# Patient Record
Sex: Female | Born: 1940
Health system: Southern US, Community
[De-identification: ages and names within clinical notes are randomized; demographics above are authoritative.]

## PROBLEM LIST (undated history)

## (undated) DIAGNOSIS — K219 Gastro-esophageal reflux disease without esophagitis: Secondary | ICD-10-CM

## (undated) DIAGNOSIS — R7989 Other specified abnormal findings of blood chemistry: Secondary | ICD-10-CM

## (undated) DIAGNOSIS — I10 Essential (primary) hypertension: Secondary | ICD-10-CM

## (undated) DIAGNOSIS — M810 Age-related osteoporosis without current pathological fracture: Secondary | ICD-10-CM

## (undated) DIAGNOSIS — M1990 Unspecified osteoarthritis, unspecified site: Secondary | ICD-10-CM

## (undated) DIAGNOSIS — R945 Abnormal results of liver function studies: Secondary | ICD-10-CM

## (undated) HISTORY — PX: CARPAL TUNNEL RELEASE: SHX101

## (undated) HISTORY — DX: Age-related osteoporosis without current pathological fracture: M81.0

## (undated) HISTORY — PX: ROTATOR CUFF REPAIR: SHX139

## (undated) SURGERY — CARPAL TUNNEL RELEASE
Anesthesia: General | Laterality: Right

---

## 1998-03-08 ENCOUNTER — Encounter: Admission: RE | Admit: 1998-03-08 | Discharge: 1998-06-06 | Payer: Self-pay | Admitting: Orthopedic Surgery

## 1998-10-27 ENCOUNTER — Other Ambulatory Visit: Admission: RE | Admit: 1998-10-27 | Discharge: 1998-10-27 | Payer: Self-pay | Admitting: Urology

## 1999-04-28 ENCOUNTER — Other Ambulatory Visit: Admission: RE | Admit: 1999-04-28 | Discharge: 1999-04-28 | Payer: Self-pay | Admitting: Urology

## 1999-12-29 ENCOUNTER — Encounter: Admission: RE | Admit: 1999-12-29 | Discharge: 1999-12-29 | Payer: Self-pay | Admitting: Family Medicine

## 1999-12-29 ENCOUNTER — Encounter: Payer: Self-pay | Admitting: Family Medicine

## 2000-05-30 ENCOUNTER — Other Ambulatory Visit: Admission: RE | Admit: 2000-05-30 | Discharge: 2000-05-30 | Payer: Self-pay | Admitting: Family Medicine

## 2001-07-23 ENCOUNTER — Encounter: Payer: Self-pay | Admitting: Family Medicine

## 2001-07-23 ENCOUNTER — Ambulatory Visit (HOSPITAL_COMMUNITY): Admission: RE | Admit: 2001-07-23 | Discharge: 2001-07-23 | Payer: Self-pay | Admitting: Family Medicine

## 2002-07-16 ENCOUNTER — Ambulatory Visit (HOSPITAL_COMMUNITY): Admission: RE | Admit: 2002-07-16 | Discharge: 2002-07-16 | Payer: Self-pay | Admitting: Family Medicine

## 2002-07-16 ENCOUNTER — Encounter: Payer: Self-pay | Admitting: Family Medicine

## 2002-08-06 ENCOUNTER — Ambulatory Visit (HOSPITAL_COMMUNITY): Admission: RE | Admit: 2002-08-06 | Discharge: 2002-08-06 | Payer: Self-pay | Admitting: Family Medicine

## 2002-08-06 ENCOUNTER — Encounter: Payer: Self-pay | Admitting: Family Medicine

## 2003-08-10 ENCOUNTER — Ambulatory Visit (HOSPITAL_COMMUNITY): Admission: RE | Admit: 2003-08-10 | Discharge: 2003-08-10 | Payer: Self-pay | Admitting: Family Medicine

## 2003-09-02 ENCOUNTER — Ambulatory Visit (HOSPITAL_COMMUNITY): Admission: RE | Admit: 2003-09-02 | Discharge: 2003-09-02 | Payer: Self-pay | Admitting: Family Medicine

## 2004-02-11 ENCOUNTER — Other Ambulatory Visit: Admission: RE | Admit: 2004-02-11 | Discharge: 2004-02-11 | Payer: Self-pay | Admitting: Family Medicine

## 2004-05-04 ENCOUNTER — Ambulatory Visit (HOSPITAL_COMMUNITY): Admission: RE | Admit: 2004-05-04 | Discharge: 2004-05-04 | Payer: Self-pay | Admitting: Gastroenterology

## 2004-08-11 ENCOUNTER — Ambulatory Visit (HOSPITAL_COMMUNITY): Admission: RE | Admit: 2004-08-11 | Discharge: 2004-08-11 | Payer: Self-pay | Admitting: Family Medicine

## 2005-08-14 ENCOUNTER — Ambulatory Visit (HOSPITAL_COMMUNITY): Admission: RE | Admit: 2005-08-14 | Discharge: 2005-08-14 | Payer: Self-pay | Admitting: Family Medicine

## 2006-06-02 ENCOUNTER — Ambulatory Visit (HOSPITAL_COMMUNITY): Admission: RE | Admit: 2006-06-02 | Discharge: 2006-06-02 | Payer: Self-pay | Admitting: Orthopedic Surgery

## 2006-08-20 ENCOUNTER — Ambulatory Visit (HOSPITAL_COMMUNITY): Admission: RE | Admit: 2006-08-20 | Discharge: 2006-08-20 | Payer: Self-pay | Admitting: Family Medicine

## 2006-11-01 ENCOUNTER — Ambulatory Visit (HOSPITAL_COMMUNITY): Admission: RE | Admit: 2006-11-01 | Discharge: 2006-11-01 | Payer: Self-pay | Admitting: Family Medicine

## 2007-08-23 ENCOUNTER — Ambulatory Visit (HOSPITAL_COMMUNITY): Admission: RE | Admit: 2007-08-23 | Discharge: 2007-08-23 | Payer: Self-pay | Admitting: Family Medicine

## 2007-09-02 ENCOUNTER — Ambulatory Visit (HOSPITAL_COMMUNITY): Admission: RE | Admit: 2007-09-02 | Discharge: 2007-09-02 | Payer: Self-pay | Admitting: Gastroenterology

## 2007-09-02 ENCOUNTER — Encounter (INDEPENDENT_AMBULATORY_CARE_PROVIDER_SITE_OTHER): Payer: Self-pay | Admitting: Interventional Radiology

## 2007-10-05 ENCOUNTER — Emergency Department (HOSPITAL_COMMUNITY): Admission: EM | Admit: 2007-10-05 | Discharge: 2007-10-05 | Payer: Self-pay | Admitting: Emergency Medicine

## 2007-11-06 ENCOUNTER — Ambulatory Visit (HOSPITAL_COMMUNITY): Admission: RE | Admit: 2007-11-06 | Discharge: 2007-11-06 | Payer: Self-pay | Admitting: Orthopedic Surgery

## 2008-02-03 ENCOUNTER — Encounter (HOSPITAL_COMMUNITY): Admission: RE | Admit: 2008-02-03 | Discharge: 2008-03-04 | Payer: Self-pay | Admitting: Orthopedic Surgery

## 2008-03-06 ENCOUNTER — Encounter (HOSPITAL_COMMUNITY): Admission: RE | Admit: 2008-03-06 | Discharge: 2008-04-05 | Payer: Self-pay | Admitting: Orthopedic Surgery

## 2008-06-24 ENCOUNTER — Ambulatory Visit (HOSPITAL_COMMUNITY): Admission: RE | Admit: 2008-06-24 | Discharge: 2008-06-24 | Payer: Self-pay | Admitting: Neurosurgery

## 2008-08-26 ENCOUNTER — Ambulatory Visit (HOSPITAL_COMMUNITY): Admission: RE | Admit: 2008-08-26 | Discharge: 2008-08-26 | Payer: Self-pay | Admitting: Family Medicine

## 2008-12-11 ENCOUNTER — Ambulatory Visit (HOSPITAL_COMMUNITY): Admission: RE | Admit: 2008-12-11 | Discharge: 2008-12-11 | Payer: Self-pay | Admitting: Orthopedic Surgery

## 2009-02-24 ENCOUNTER — Ambulatory Visit (HOSPITAL_COMMUNITY): Admission: RE | Admit: 2009-02-24 | Discharge: 2009-02-24 | Payer: Self-pay | Admitting: Orthopedic Surgery

## 2009-03-30 ENCOUNTER — Ambulatory Visit (HOSPITAL_COMMUNITY): Admission: RE | Admit: 2009-03-30 | Discharge: 2009-03-31 | Payer: Self-pay | Admitting: Orthopedic Surgery

## 2009-04-28 ENCOUNTER — Encounter (HOSPITAL_COMMUNITY): Admission: RE | Admit: 2009-04-28 | Discharge: 2009-05-28 | Payer: Self-pay | Admitting: Orthopedic Surgery

## 2009-05-29 ENCOUNTER — Encounter (HOSPITAL_COMMUNITY): Admission: RE | Admit: 2009-05-29 | Discharge: 2009-06-28 | Payer: Self-pay | Admitting: Orthopedic Surgery

## 2009-06-29 ENCOUNTER — Encounter (HOSPITAL_COMMUNITY): Admission: RE | Admit: 2009-06-29 | Discharge: 2009-07-23 | Payer: Self-pay | Admitting: Orthopedic Surgery

## 2009-07-27 ENCOUNTER — Encounter (HOSPITAL_COMMUNITY): Admission: RE | Admit: 2009-07-27 | Discharge: 2009-08-26 | Payer: Self-pay | Admitting: Orthopedic Surgery

## 2009-09-02 ENCOUNTER — Ambulatory Visit (HOSPITAL_COMMUNITY): Admission: RE | Admit: 2009-09-02 | Discharge: 2009-09-02 | Payer: Self-pay | Admitting: Family Medicine

## 2010-06-03 ENCOUNTER — Encounter: Admission: RE | Admit: 2010-06-03 | Discharge: 2010-06-03 | Payer: Self-pay | Admitting: Gastroenterology

## 2010-08-14 ENCOUNTER — Encounter: Payer: Self-pay | Admitting: Orthopedic Surgery

## 2010-08-29 ENCOUNTER — Other Ambulatory Visit (HOSPITAL_COMMUNITY): Payer: Self-pay | Admitting: Family Medicine

## 2010-08-29 DIAGNOSIS — Z139 Encounter for screening, unspecified: Secondary | ICD-10-CM

## 2010-09-05 ENCOUNTER — Ambulatory Visit (HOSPITAL_COMMUNITY)
Admission: RE | Admit: 2010-09-05 | Discharge: 2010-09-05 | Disposition: A | Discharge status: Home / Self Care | Payer: MEDICARE | Source: Ambulatory Visit | Attending: Family Medicine | Admitting: Family Medicine

## 2010-09-05 DIAGNOSIS — Z1231 Encounter for screening mammogram for malignant neoplasm of breast: Secondary | ICD-10-CM | POA: Insufficient documentation

## 2010-09-05 DIAGNOSIS — Z139 Encounter for screening, unspecified: Secondary | ICD-10-CM

## 2010-09-16 ENCOUNTER — Ambulatory Visit (HOSPITAL_COMMUNITY)
Admission: RE | Admit: 2010-09-16 | Discharge: 2010-09-16 | Disposition: A | Discharge status: Home / Self Care | Payer: MEDICARE | Source: Ambulatory Visit | Attending: Orthopedic Surgery | Admitting: Orthopedic Surgery

## 2010-09-16 ENCOUNTER — Other Ambulatory Visit (HOSPITAL_COMMUNITY): Payer: Self-pay | Admitting: Orthopedic Surgery

## 2010-09-16 DIAGNOSIS — L539 Erythematous condition, unspecified: Secondary | ICD-10-CM

## 2010-09-16 DIAGNOSIS — M25539 Pain in unspecified wrist: Secondary | ICD-10-CM | POA: Insufficient documentation

## 2010-09-16 DIAGNOSIS — R52 Pain, unspecified: Secondary | ICD-10-CM

## 2010-09-16 DIAGNOSIS — M19039 Primary osteoarthritis, unspecified wrist: Secondary | ICD-10-CM | POA: Insufficient documentation

## 2010-09-16 DIAGNOSIS — R609 Edema, unspecified: Secondary | ICD-10-CM | POA: Insufficient documentation

## 2010-09-16 DIAGNOSIS — M11239 Other chondrocalcinosis, unspecified wrist: Secondary | ICD-10-CM | POA: Insufficient documentation

## 2010-09-16 DIAGNOSIS — M249 Joint derangement, unspecified: Secondary | ICD-10-CM | POA: Insufficient documentation

## 2010-10-28 LAB — COMPREHENSIVE METABOLIC PANEL
ALT: 62 U/L — ABNORMAL HIGH (ref 0–35)
Alkaline Phosphatase: 174 U/L — ABNORMAL HIGH (ref 39–117)
CO2: 28 mEq/L (ref 19–32)
Chloride: 99 mEq/L (ref 96–112)
Glucose, Bld: 112 mg/dL — ABNORMAL HIGH (ref 70–99)
Potassium: 3.4 mEq/L — ABNORMAL LOW (ref 3.5–5.1)
Sodium: 134 mEq/L — ABNORMAL LOW (ref 135–145)
Total Bilirubin: 0.6 mg/dL (ref 0.3–1.2)
Total Protein: 7.9 g/dL (ref 6.0–8.3)

## 2010-10-28 LAB — URINALYSIS, ROUTINE W REFLEX MICROSCOPIC
Glucose, UA: NEGATIVE mg/dL
Ketones, ur: NEGATIVE mg/dL
Leukocytes, UA: NEGATIVE
Protein, ur: NEGATIVE mg/dL
Urobilinogen, UA: 0.2 mg/dL (ref 0.0–1.0)

## 2010-10-28 LAB — CBC
Hemoglobin: 14.1 g/dL (ref 12.0–15.0)
RBC: 4.3 MIL/uL (ref 3.87–5.11)
RDW: 12.8 % (ref 11.5–15.5)
WBC: 8.8 10*3/uL (ref 4.0–10.5)

## 2010-10-28 LAB — GLUCOSE, CAPILLARY
Glucose-Capillary: 106 mg/dL — ABNORMAL HIGH (ref 70–99)
Glucose-Capillary: 126 mg/dL — ABNORMAL HIGH (ref 70–99)
Glucose-Capillary: 140 mg/dL — ABNORMAL HIGH (ref 70–99)
Glucose-Capillary: 95 mg/dL (ref 70–99)

## 2010-12-09 NOTE — Op Note (Signed)
NAMEJAMIN, Leslie Berry                ACCOUNT NO.:  [id number]   MEDICAL RECORD NO.:  [id number]          PATIENT TYPE:  AMB   LOCATION:  ENDO                         FACILITY:  [facility]   PHYSICIAN:  Anselmo Rod, M.D.  DATE OF BIRTH:  1941-05-14   DATE OF PROCEDURE:  05/04/2004  DATE OF DISCHARGE:                                 OPERATIVE REPORT   PROCEDURE PERFORMED:  Esophagogastroduodenoscopy with antral biopsy for  CLOtest.   ENDOSCOPIST:  Anselmo Rod, M.D.   INSTRUMENT USED:  Olympus video panendoscope.   INDICATION FOR PROCEDURE:  A 70 year old African-American female undergoing  an EGD for guaiac-positive stools.  Rule out peptic ulcer disease,  esophagitis, gastritis, etc.   PREPROCEDURE PREPARATION:  The patient was fasted for eight hours prior to  the procedure.   PREPROCEDURE PHYSICAL:  VITAL SIGNS:  The patient had stable vital signs.  NECK:  Supple.  CHEST:  Clear to auscultation.  S1, S2 regular.  ABDOMEN:  Soft with normal bowel sounds.   DESCRIPTION OF PROCEDURE:  The patient was placed in the left lateral  decubitus position and sedated with Demerol and Versed for the EGD.  No  additional sedation was used for the colonoscopy.  Once the patient was  adequately sedate and maintained on low-flow oxygen and continuous cardiac  monitoring, the Olympus video panendoscope was advanced through the  mouthpiece, over the tongue, and into the esophagus under direct vision.  The entire esophagus appeared normal proximally.  Mild distal esophagitis  noted at the GE junction.  A small hiatal hernia was seen on high  retroflexion.  Diffuse gastritis was noted throughout the gastric mucosa.  Antral biopsies were done x1 for CLOtest.  The proximal small bowel appeared  normal.  There was no outlet obstruction.  The patient had a prominent  ampulla.  The patient tolerated the procedure well without immediate  complications.   IMPRESSION:  1.  Distal esophagitis at  gastroesophageal junction.  2.  Small hiatal hernia.  3.  Diffuse gastritis, CLOtest done.  4.  Normal proximal small bowel, prominent ampulla.   RECOMMENDATIONS:  1.  Await CLOtest results, treat with antibiotics if CLOtest positive.  2.  Avoid nonsteroidals.  3.  Outpatient follow-up for repeat guaiac testing.  Further recommendations      will be made thereafter.       JNM/MEDQ  D:  05/04/2004  T:  05/04/2004  Job:  [id number]   cc:   Renaye Rakers, M.D.  [phone] [address]  [place]  [place] [address]  Fax: [phone]

## 2010-12-09 NOTE — Op Note (Signed)
Leslie Berry, Leslie Berry                ACCOUNT NO.:  [id number]   MEDICAL RECORD NO.:  [id number]          PATIENT TYPE:  AMB   LOCATION:  ENDO                         FACILITY:  [facility]   PHYSICIAN:  Anselmo Rod, M.D.  DATE OF BIRTH:  1940/12/19   DATE OF PROCEDURE:  05/04/2004  DATE OF DISCHARGE:                                 OPERATIVE REPORT   PROCEDURE PERFORMED:  Screening colonoscopy.   ENDOSCOPIST:  Anselmo Rod, M.D.   INSTRUMENT USED:  Olympus video colonoscope.   INDICATION FOR PROCEDURE:  A 70 year old African-American female with a  history of guaiac-positive stool and a family history of colon cancer,  undergoing a screening colonoscopy.  Rule out colonic polyps, masses, etc.   PREPROCEDURE PREPARATION:  Informed consent was procured from the patient.  The patient was fasted for eight hours prior to the procedure and prepped  with a bottle of magnesium citrate and a gallon of GoLYTELY the night prior  to the procedure.   PREPROCEDURE PHYSICAL:  VITAL SIGNS:  The patient had stable vital signs.  NECK:  Supple.  CHEST:  Clear to auscultation.  S1, S2 regular.  ABDOMEN:  Soft with normal bowel sounds.   DESCRIPTION OF PROCEDURE:  The patient was placed in the left lateral  decubitus position and sedated with 75 mg of Demerol and 5 mg of Versed in  slow incremental doses.  Once the patient was adequately sedate and  maintained on low-flow oxygen and continuous cardiac monitoring, the Olympus  video colonoscope was advanced from the rectum to the cecum.  The  appendiceal orifice and the ileocecal valve were clearly visualized and  photographed.  The patient's position was changed from the left lateral to  the supine position with gentle application of abdominal pressure to reach  the cecum.  There was evidence of sigmoid diverticulosis.  No masses,  polyps, erosions, or ulcerations were seen.  Retroflexion in the rectum  revealed small internal hemorrhoids.  The  patient tolerated the procedure  well without immediate complications.  The terminal ileum appeared normal.   IMPRESSION:  1.  Small internal hemorrhoids.  2.  Sigmoid diverticulosis.  3.  No masses or polyps seen.  4.  Normal terminal ileum.  5.  Some residual stool in the colon, multiple washes done.   RECOMMENDATIONS:  1.  Brochures on diverticulosis along with a high-fiber diet have been given      to the patient for her education.  2.  Proceed with an EGD at this time.  Further recommendations made      thereafter.  3.  Repeat colonoscopy in the next five years unless the patient develops      any abdominal symptoms in the interim.       JNM/MEDQ  D:  05/04/2004  T:  05/04/2004  Job:  [id number]   cc:   Renaye Rakers, M.D.  [phone] [address]  [place]  [place] [address]  Fax: [phone]

## 2011-02-20 ENCOUNTER — Other Ambulatory Visit (HOSPITAL_COMMUNITY): Payer: Self-pay | Admitting: Orthopedic Surgery

## 2011-02-20 ENCOUNTER — Ambulatory Visit (HOSPITAL_COMMUNITY)
Admission: RE | Admit: 2011-02-20 | Discharge: 2011-02-20 | Disposition: A | Discharge status: Home / Self Care | Payer: PRIVATE HEALTH INSURANCE | Source: Ambulatory Visit | Attending: Orthopedic Surgery | Admitting: Orthopedic Surgery

## 2011-02-20 DIAGNOSIS — M11239 Other chondrocalcinosis, unspecified wrist: Secondary | ICD-10-CM | POA: Insufficient documentation

## 2011-02-20 DIAGNOSIS — M25539 Pain in unspecified wrist: Secondary | ICD-10-CM | POA: Insufficient documentation

## 2011-02-20 DIAGNOSIS — M899 Disorder of bone, unspecified: Secondary | ICD-10-CM | POA: Insufficient documentation

## 2011-02-20 DIAGNOSIS — R937 Abnormal findings on diagnostic imaging of other parts of musculoskeletal system: Secondary | ICD-10-CM | POA: Insufficient documentation

## 2011-04-14 LAB — CBC
Hemoglobin: 14.7
MCHC: 34.4
Platelets: 268
RDW: 13.2

## 2011-04-14 LAB — PROTIME-INR: INR: 1

## 2011-04-17 LAB — BASIC METABOLIC PANEL
BUN: 15
Calcium: 9.5
Chloride: 101
Creatinine, Ser: 0.7
GFR calc Af Amer: >60

## 2011-04-17 LAB — CBC
MCHC: 34.4
MCV: 94.4
Platelets: 246
WBC: 8.4

## 2011-04-17 LAB — URIC ACID: Uric Acid, Serum: 8 — ABNORMAL HIGH

## 2011-04-17 LAB — DIFFERENTIAL
Basophils Relative: 1
Eosinophils Absolute: 0.1
Lymphs Abs: 2.4
Neutro Abs: 5.2
Neutrophils Relative %: 61

## 2011-05-17 ENCOUNTER — Ambulatory Visit (HOSPITAL_COMMUNITY)
Admission: RE | Admit: 2011-05-17 | Discharge: 2011-05-17 | Disposition: A | Discharge status: Home / Self Care | Payer: 59 | Source: Ambulatory Visit | Attending: Orthopedic Surgery | Admitting: Orthopedic Surgery

## 2011-05-17 ENCOUNTER — Other Ambulatory Visit (HOSPITAL_COMMUNITY): Payer: Self-pay | Admitting: Orthopedic Surgery

## 2011-05-17 DIAGNOSIS — M25559 Pain in unspecified hip: Secondary | ICD-10-CM | POA: Insufficient documentation

## 2011-05-17 DIAGNOSIS — M659 Synovitis and tenosynovitis, unspecified: Secondary | ICD-10-CM

## 2011-05-30 ENCOUNTER — Ambulatory Visit (HOSPITAL_COMMUNITY)
Admission: RE | Admit: 2011-05-30 | Discharge: 2011-05-30 | Disposition: A | Discharge status: Home / Self Care | Payer: Medicare Other | Source: Ambulatory Visit | Attending: Orthopedic Surgery | Admitting: Orthopedic Surgery

## 2011-05-30 DIAGNOSIS — M6281 Muscle weakness (generalized): Secondary | ICD-10-CM | POA: Insufficient documentation

## 2011-05-30 DIAGNOSIS — R262 Difficulty in walking, not elsewhere classified: Secondary | ICD-10-CM | POA: Insufficient documentation

## 2011-05-30 DIAGNOSIS — M25559 Pain in unspecified hip: Secondary | ICD-10-CM | POA: Insufficient documentation

## 2011-05-30 DIAGNOSIS — R29898 Other symptoms and signs involving the musculoskeletal system: Secondary | ICD-10-CM | POA: Insufficient documentation

## 2011-05-30 DIAGNOSIS — IMO0001 Reserved for inherently not codable concepts without codable children: Secondary | ICD-10-CM | POA: Insufficient documentation

## 2011-05-30 NOTE — Patient Instructions (Signed)
HEP

## 2011-05-30 NOTE — Progress Notes (Signed)
Physical Therapy Evaluation  Patient Details  Name: Leslie Berry MRN: [id number] Date of Birth: Dec 17, 1940  Today's Date: 05/30/2011 Time: 4098-1191 Time Calculation (min): 44 min Visit#: 1  of 6   Re-eval: 06/13/11 Assessment Diagnosis: L hip pain. Next MD Visit: 06/07/2011 Charge:  Evaluation, ultrasound Past Medical History: No past medical history on file. Past Surgical History: No past surgical history on file.  Subjective Symptoms/Limitations Symptoms: Leslie Berry states that she had progressive L hip pain for the past three weeks.  She states that she has had back pain for two to three years that will occasionally go into her left leg.  Recently, however, it feels as if her L hip wants to lock up on her.  She states that her hip does not bother her when she is sitting but it almost immediately bothers her when she starts walking.  The patient states that she feels as if she is going to fall at times. Limitations: Standing;Walking How long can you sit comfortably?: Sitting is no problem. How long can you stand comfortably?: The patient states that she has some problems standing stating that she needs to take a break after 10-15 minutes. How long can you walk comfortably?: The patient states that she can walk less than 5 minutes due to pain. Pain Assessment Currently in Pain?:  (Pt is sitting; states with walking pain increases to an 8.) Pain Score:   8 Pain Location: Hip Pain Orientation: Left;Lateral Pain Type: Acute pain Pain Frequency: Intermittent Pain Relieving Factors: sitting down gives almost immediate relief. Multiple Pain Sites: Yes  Prior Functioning  Home Living Home Layout: One level Home Access: Stairs to enter Entrance Stairs-Rails: Left Entrance Stairs-Number of Steps: 2 (unable to take steps reciprocally.)  Assessment RLE Strength Right Hip Flexion: 5/5 Right Hip Extension: 5/5 Right Hip ABduction: 5/5 Right Hip ADduction: 5/5 Right Knee Flexion:  5/5 Right Knee Extension: 5/5 Right Ankle Dorsiflexion: 5/5 LLE Strength Left Hip Flexion: 3/5 Left Hip Extension: 3/5 Left Hip ABduction: 2/5 Left Hip ADduction: 5/5 Left Knee Flexion: 5/5 Left Knee Extension: 5/5 Left Ankle Dorsiflexion: 4/5 Lumbar AROM Lumbar Flexion: wnl  Lumbar Extension: wnl with repetition no change Lumbar - Right Side Bend: wnl  Lumbar - Left Side Bend: wnl Lumbar - Right Rotation: wnl Lumbar - Left Rotation: wnl  Exercise/Treatments Stretches Quad Stretch: Limitations;5 reps (knee to chest ) Hip Exercises Straight Leg Raises: Left;5 reps;Limitations (knee bent to 40 degrees.) Hip ABduction/ADduction: 10 reps;Limitations (done supine as clam) Bridges: 10 reps   Modalities Modalities: Ultrasound Ultrasound Ultrasound Location: L greater trochanter Ultrasound Parameters: 2 mghz at 1.2 w/cm 2 x 8 min. Ultrasound Goals: Pain  Physical Therapy Assessment and Plan PT Assessment and Plan Clinical Impression Statement: Pt with decreased strength of L hip as well as pain causing antalgic gait and decreased functional ability.  Pt will benefit from skilled PT to address the above issues. Rehab Potential: Good Clinical Impairments Affecting Rehab Potential: pain, mm weakness PT Frequency: Min 3X/week PT Duration:  (2 weeks) PT Treatment/Interventions: Gait training;Therapeutic exercise;Other (comment) (modalities for pain) PT Plan: See 3x wk for 2 weeks.  Next treatment add Bike, ITBand stretch, supine ab/adduction, isometric adduction/abduction and gait training.    Goals Home Exercise Program Pt will Perform Home Exercise Program: Independently PT Short Term Goals Time to Complete Short Term Goals: 2 weeks PT Short Term Goal 1: Pain decreased to no greater than a 2 PT Short Term Goal 2: strength improvec 1/2 grade. PT  Short Term Goal 3: normalized heel toe gt with = stance time.  Problem List Patient Active Problem List  Diagnoses  . Left leg  weakness    PT - End of Session Activity Tolerance: Patient tolerated treatment well General Behavior During Session: [facility] for tasks performed Cognition: [facility] for tasks performed   Leslie Berry,Leslie Berry 05/30/2011, 10:56 AM  Physician Documentation Your signature is required to indicate approval of the treatment plan as stated above.  Please sign and either send electronically or make a copy of this report for your files and return this physician signed original.   Please mark one 1.__approve of plan  2. ___approve of plan with the following conditions.   ______________________________                                                          _____________________ Physician Signature                                                                                                             Date

## 2011-06-01 ENCOUNTER — Ambulatory Visit (HOSPITAL_COMMUNITY)
Admission: RE | Admit: 2011-06-01 | Discharge: 2011-06-01 | Disposition: A | Discharge status: Home / Self Care | Payer: Medicare Other | Source: Ambulatory Visit | Attending: Orthopedic Surgery | Admitting: Orthopedic Surgery

## 2011-06-01 NOTE — Progress Notes (Signed)
Physical Therapy Treatment Patient Details  Name: Leslie Berry MRN: [id number] Date of Birth: 09-Aug-1940  Today's Date: 06/01/2011 Time: 4782-9562 Time Calculation (min): 37 min Visit#: 2  of 6   Re-eval: 06/13/11 Charges:  Therex x 23' Ultrasound x 8'  Subjective: Symptoms/Limitations Symptoms: Pt reports that her hip only hurts when she's walking. Pain Assessment Currently in Pain?: Yes (with walking) Pain Score:   8 Pain Location: Hip Pain Orientation: Left   Exercise/Treatments Stretches Single Knee to Chest Stretch: 3 reps;30 seconds Stability Clam: 10 reps Bridge: 10 reps Isometric Hip Flexion: 10 reps;Supine Machine Exercises Stationary Bike: 6'@2 .0 Hip Exercises Straight Leg Raises: 10 reps;Left;Limitations Straight Leg Raises Limitations: knee bent to 40 degrees Hip ABduction/ADduction: Left;10 reps (Isometric and AROM insupine x 10)  Modalities Modalities: Ultrasound Ultrasound Ultrasound Location: L greater trochanter Ultrasound Parameters: at 1.2 w/cm2 x 8' Ultrasound Goals: Pain  Physical Therapy Assessment and Plan PT Assessment and Plan Clinical Impression Statement: Pt completes therex with minimal difficulty and minimal need for cueing. Pt with decreased antalgia this tx. Pt reprots pain decrease to 0/10 with wt bearing.  PT Treatment/Interventions: Therapeutic exercise;Other (comment) (Ultrasound) PT Plan: Continue to progress per PT POC.  Begin gait training on TM next tx.     Problem List Patient Active Problem List  Diagnoses  . Left leg weakness    PT - End of Session Activity Tolerance: Patient tolerated treatment well General Behavior During Session: [facility] for tasks performed Cognition: [facility] for tasks performed  Antonieta Iba 06/01/2011, 2:59 PM

## 2011-06-01 NOTE — Patient Instructions (Signed)
Continue to progress per PT POC. Begin gt trainging on TM next tx.

## 2011-06-06 ENCOUNTER — Ambulatory Visit (HOSPITAL_COMMUNITY)
Admission: RE | Admit: 2011-06-06 | Discharge: 2011-06-06 | Disposition: A | Discharge status: Home / Self Care | Payer: Medicare Other | Source: Ambulatory Visit | Attending: Family Medicine | Admitting: Family Medicine

## 2011-06-06 DIAGNOSIS — R29898 Other symptoms and signs involving the musculoskeletal system: Secondary | ICD-10-CM

## 2011-06-06 NOTE — Progress Notes (Signed)
Physical Therapy Treatment Patient Details  Name: Leslie Berry MRN: [id number] Date of Birth: [birth year]  Today's Date: 06/06/2011 Time: 4098-1191 Time Calculation (min): 40 min Visit#: 3  of 6   Re-eval: 06/13/11  Charge: gait 11 min therex 21 min [place] 8  Subjective: Symptoms/Limitations Symptoms: No pain when standing for short duration or sitting but pain while walking 7/10 L hip. Pain Assessment Pain Score:   7 Pain Location: Hip Pain Orientation: Left  Objective:   Exercise/Treatments Stretches Passive Hamstring Stretch: 3 reps;30 seconds ITB Stretch: 3 reps;30 seconds Stability Clam: 10 reps Bridge: 10 reps Isometric Hip Flexion: 10 reps;Supine Straight Leg Raise: 10 reps Hip Abduction: 10 reps;Side-lying Leg Raise: Prone;Left;10 reps Functional Squats: Standing;10 reps;3 seconds  Gait Training: Gait training for heel to toe, equal stride length x 4' Tread Mill: 7' @ 1.0 -- 1.2 with max cueing for proper gait mechanics, heel to toe, equal stride length  Modalities Modalities: Ultrasound Ultrasound Ultrasound Location: L greater trochanter Ultrasound Parameters: 1 MHz @ 1.2 w/cm2 continous x 8' Ultrasound Goals: Pain  Physical Therapy Assessment and Plan PT Assessment and Plan Clinical Impression Statement: Pt required maximum multimodal cueing for proper gait mechanics and education behind the proper technique.  Max cueing for heel to toe gait and equal stance phases.  Pt with decreased antalgia at end of session.  Added therex exercises with min cueing required for proper form, pt able to complete with min difficulty with good control shown. PT Plan: Continue to progress POC with focus on hip musculator strengthening, improving gait mechanics and pain reduction.  Add heel/toe raises next session.    Goals    Problem List Patient Active Problem List  Diagnoses  . Left leg weakness    PT - End of Session Activity Tolerance: Patient tolerated treatment  well General Behavior During Session: [facility] for tasks performed Cognition: [facility] for tasks performed  Juel Burrow 06/06/2011, 2:56 PM

## 2011-06-07 ENCOUNTER — Ambulatory Visit (HOSPITAL_COMMUNITY): Admission: RE | Admit: 2011-06-07 | Payer: Medicare Other | Source: Ambulatory Visit | Admitting: *Deleted

## 2011-06-09 ENCOUNTER — Ambulatory Visit (HOSPITAL_COMMUNITY)
Admission: RE | Admit: 2011-06-09 | Discharge: 2011-06-09 | Disposition: A | Discharge status: Home / Self Care | Payer: Medicare Other | Source: Ambulatory Visit | Attending: Family Medicine | Admitting: Family Medicine

## 2011-06-09 DIAGNOSIS — R29898 Other symptoms and signs involving the musculoskeletal system: Secondary | ICD-10-CM

## 2011-06-09 NOTE — Progress Notes (Signed)
Physical Therapy Treatment Patient Details  Name: Leslie Berry MRN: [id number] Date of Birth: May 03, 1941  Today's Date: 06/09/2011 Time: 4098-1191 Time Calculation (min): 41 min Visit#: 4  of 6   Re-eval: 06/13/11  charge there ex 38  Subjective: Symptoms/Limitations Symptoms: Pt states that she started a new medication yesterday and it seems to be helping. Pain Assessment Currently in Pain?: No/denies   Exercise/Treatments Stretches Passive Hamstring Stretch: 3 reps;30 seconds Single Knee to Chest Stretch: 3 reps;30 seconds Double Knee to Chest Stretch: 3 reps;30 seconds Lower Trunk Rotation:  (pelvic tilt x10) Lumbar Exercises   Stability Clam:  (sitting IR/ER x 10) Bridge: 10 reps Isometric Hip Flexion: 10 reps Straight Leg Raise: 10 reps Hip Abduction: 10 reps Heel Squeeze: 10 reps Leg Raise: 10 reps Functional Squats: 10 reps Machine Exercises Tread Mill: 5' @1 .1 with verbal cuing needed  to keep up straight and abs tight.   Physical Therapy Assessment and Plan PT Assessment and Plan Clinical Impression Statement: Pt continues to need maximal verbal and some tactile cuing to complete ex correctly.  Added SL abduction and sitting IR/ER.  No ultrasound today due to pt stating that she is in no pain Rehab Potential: Good PT Plan: begin heel raises/wall squat next treatment    Goals    Problem List Patient Active Problem List  Diagnoses  . Left leg weakness    PT - End of Session Activity Tolerance: Patient tolerated treatment well General Behavior During Session: [facility] for tasks performed Cognition: [facility] for tasks performed  RUSSELL,CINDY 06/09/2011, 4:11 PM

## 2011-06-12 ENCOUNTER — Ambulatory Visit (HOSPITAL_COMMUNITY)
Admission: RE | Admit: 2011-06-12 | Discharge: 2011-06-12 | Disposition: A | Discharge status: Home / Self Care | Payer: Medicare Other | Source: Ambulatory Visit | Attending: Family Medicine | Admitting: Family Medicine

## 2011-06-12 DIAGNOSIS — R29898 Other symptoms and signs involving the musculoskeletal system: Secondary | ICD-10-CM

## 2011-06-12 NOTE — Progress Notes (Signed)
Physical Therapy Treatment Patient Details  Name: Leslie Berry MRN: [id number] Date of Birth: November 18, 1940  Today's Date: 06/12/2011 Time: 4132-4401 Time Calculation (min): 47 min Visit#: 5  of 6   Re-eval: 06/13/11  charge there ex 33; [place] 8  Subjective: Symptoms/Limitations Symptoms: When I'm lying or sitting the pain is okay it's just when I walk Pain Assessment Currently in Pain?: Yes Pain Score:   8 Pain Location: Back Pain Orientation: Left Pain Radiating Towards: L thigh   Exercise/Treatments Stretches Passive Hamstring Stretch:  (D/C) Single Knee to Chest Stretch:  (D/C) Double Knee to Chest Stretch:  (D/C) Lumbar Exercises   Stability Clam: Side-lying;10 reps;Other (comment) Bridge: 15 reps Straight Leg Raise: 10 reps Hip Abduction: 10 reps Heel Squeeze: 10 reps Single Arm Raise: 10 reps Leg Raise: 10 reps Opposite Arm/Leg Raise: 10 reps Functional Squats: 10 reps Heel Raises: 10 reps Lifting:  (Pt worked on L LE hip abduction/extension with R LE on step ) Machine Exercises Tread Mill:  (Amb in dept )  Modalities Modalities: Ultrasound Ultrasound Ultrasound Location: 1.5 w/xm2 @1mHZ  to L sciatic notch area.  Physical Therapy Assessment and Plan PT Assessment and Plan Rehab Potential: Good PT Frequency: Min 2X/week PT Plan: D/C stretching ex pt mm length is wnl;  Continue with prone ex add POE and press up next treatment as well as wall squats     Problem List Patient Active Problem List  Diagnoses  . Left leg weakness    PT - End of Session Activity Tolerance: Patient tolerated treatment well General Behavior During Session: [facility] for tasks performed  Dylin Ihnen,CINDY 06/12/2011, 3:26 PM

## 2011-06-14 ENCOUNTER — Ambulatory Visit (HOSPITAL_COMMUNITY)
Admission: RE | Admit: 2011-06-14 | Discharge: 2011-06-14 | Disposition: A | Discharge status: Home / Self Care | Payer: Medicare Other | Source: Ambulatory Visit | Attending: Orthopedic Surgery | Admitting: Orthopedic Surgery

## 2011-06-14 NOTE — Progress Notes (Signed)
Physical Therapy Treatment Patient Details  Name: Leslie Berry MRN: [id number] Date of Birth: 1940/10/25  Today's Date: 06/14/2011 Time: 1308-6578 Time Calculation (min): 55 min Visit#: 6  of 6   Re-eval: 06/13/11 Charges: Therex x 40' Ultrasound x 8'  Subjective: It only bothers me when I'm walking. Pain:7/10 L hip with walking   Exercise/Treatments  06/14/11 1300  Stability Exercises  Straight Leg Raise 10 reps  Heel Squeeze 15 reps  Single Arm Raise 10 reps  Leg Raise 10 reps  Opposite Arm/Leg Raise 10 reps  Functional Squats 15 reps  Heel Raises 15 reps  Lifting Limitations  Lifting Limitations abduction/extension with R LE on step x 15     Modalities Modalities: Ultrasound Ultrasound Ultrasound Location: 1.2 w/cm2 @1mHZ   L posterior hip Ultrasound Parameters: 1.2 w/cm2 @ 1 MHz x8'  Physical Therapy Assessment and Plan PT Assessment and Plan Clinical Impression Statement: Pt requires multimodal cueing to complete therex properly. Ultrasound completed today secondary to pain with standing. Pt reports pain decrease with wt bearing to 6/10. PT Treatment/Interventions: Therapeutic exercise;Other (comment) (Ultrasound) PT Plan: Continue to progress per PT POC.     Problem List Patient Active Problem List  Diagnoses  . Left leg weakness    PT - End of Session Activity Tolerance: Patient tolerated treatment well General Behavior During Session: [facility] for tasks performed Cognition: [facility] for tasks performed  Antonieta Iba 06/14/2011, 5:33 PM

## 2011-06-19 ENCOUNTER — Ambulatory Visit (HOSPITAL_COMMUNITY)
Admission: RE | Admit: 2011-06-19 | Discharge: 2011-06-19 | Disposition: A | Discharge status: Home / Self Care | Payer: Medicare Other | Source: Ambulatory Visit | Attending: Orthopedic Surgery | Admitting: Orthopedic Surgery

## 2011-06-19 NOTE — Progress Notes (Signed)
Physical Therapy Treatment Patient Details  Name: Leslie Berry MRN: [id number] Date of Birth: 11-19-1940  Today's Date: 06/19/2011 Time: 4098-1191 Time Calculation (min): 37 min Visit#: 7  of 9   Re-eval: 06/23/11 Charges:  therex 36'  Subjective: Symptoms/Limitations Symptoms: Thought I was OK until I walked through the parking lot.  7/10 pain today in L lateral knee; slight pain in L hip 1/10. Pain Assessment Currently in Pain?: Yes Pain Score:   7 Pain Location: Leg Pain Orientation: Left Pain Relieving Factors: All pain goes away when NWB  Exercise/Treatments Stability Clam: Side-lying;15 reps;Other (comment) Dead Bug: Limitations Dead Bug Limitations: sidelying hip abduction 10X each Bridge: 15 reps Isometric Hip Flexion: 15 reps Straight Leg Raise: 15 reps Heel Squeeze: 15 reps Single Arm Raise: 10 reps Leg Raise: 10 reps Opposite Arm/Leg Raise: 10 reps Functional Squats: 15 reps Heel Raises: 15 reps Lifting Limitations: abduction/extension with R LE on step x 15  Physical Therapy Assessment and Plan PT Assessment and Plan Clinical Impression Statement: Continues to require cues for stability with therex; added sidelying hip abduction to increase strength; able to complete all therex without difficulty. PT Plan: Continue X 2 more visits; Returns to MD 06/26/11.     Problem List Patient Active Problem List  Diagnoses  . Left leg weakness    PT - End of Session Activity Tolerance: Patient tolerated treatment well General Behavior During Session: [facility] for tasks performed Cognition: [facility] for tasks performed  Emeline Gins B 06/19/2011, 1:42 PM

## 2011-06-21 ENCOUNTER — Ambulatory Visit (HOSPITAL_COMMUNITY)
Admission: RE | Admit: 2011-06-21 | Discharge: 2011-06-21 | Disposition: A | Discharge status: Home / Self Care | Payer: Medicare Other | Source: Ambulatory Visit | Attending: Family Medicine | Admitting: Family Medicine

## 2011-06-21 NOTE — Progress Notes (Signed)
Physical Therapy Treatment Patient Details  Name: Leslie Berry MRN: [id number] Date of Birth: Jun 01, 1941  Today's Date: 06/21/2011 Time: 4098-1191 Time Calculation (min): 45 min Visit#: 8  of 9   Re-eval: 06/23/11 Charges: Therex x 42'  Subjective: Symptoms/Limitations Symptoms: My pain was about an 8/10 before I took my pain pill. Now it's about a 6/10. Pain Assessment Currently in Pain?: Yes Pain Score:   6 Pain Location: Leg Pain Orientation: Left   Exercise/Treatments Stretches ITB Stretch: 3 reps;30 seconds;Limitations ITB Stretch Limitations: w/rope and against wall and against wall Stability Clam: 15 reps;Side-lying Bridge: 15 reps Isometric Hip Flexion: 15 reps Straight Leg Raise: 15 reps Hip Abduction: 15 reps;Side-lying Heel Squeeze: 15 reps Single Arm Raise: 15 reps Leg Raise: 15 reps Opposite Arm/Leg Raise: 10 reps Functional Squats: 20 reps Heel Raises: 20 reps Lifting Limitations: abduction/extension with R LE on step x 20  Physical Therapy Assessment and Plan PT Assessment and Plan Clinical Impression Statement: Began ITB stretch against wall. HEP given for ITB stretch. Pt reports no increase in pain at end ofsession. Pt also states that she did not feel a difference after holding the ultrasound last tx. Pt requires decreased cueing with exercises this session. PT Plan: Reassess next session.     Problem List Patient Active Problem List  Diagnoses  . Left leg weakness    PT - End of Session Activity Tolerance: Patient tolerated treatment well General Behavior During Session: [facility] for tasks performed Cognition: [facility] for tasks performed  Antonieta Iba 06/21/2011, 1:54 PM

## 2011-06-23 ENCOUNTER — Ambulatory Visit (HOSPITAL_COMMUNITY)
Admission: RE | Admit: 2011-06-23 | Discharge: 2011-06-23 | Disposition: A | Discharge status: Home / Self Care | Payer: Medicare Other | Source: Ambulatory Visit | Attending: Orthopedic Surgery | Admitting: Orthopedic Surgery

## 2011-06-23 DIAGNOSIS — R29898 Other symptoms and signs involving the musculoskeletal system: Secondary | ICD-10-CM

## 2011-06-23 NOTE — Progress Notes (Signed)
Physical Therapy Evaluation  Patient Details  Name: BYANKA LANDRUS MRN: [id number] Date of Birth: 1940/11/19  Today's Date: 06/23/2011 Time: 7829-5621 Time Calculation (min): 51 min Visit#: 9  of 9   Re-eval: 06/23/11  charge: mmtest, traction , ex  Past Medical History: No past medical history on file. Past Surgical History: No past surgical history on file.  Subjective Symptoms/Limitations Symptoms: The patient states that her hip pain has not improved.   How long can you sit comfortably?: Pt has no problem sitting.  At eval she had no difficulties sitting. How long can you stand comfortably?: The patient states that she is able to stand for 10-15 min before she has to take a break.  This has not changed. How long can you walk comfortably?: The patient states that she is able to walk for about ten minutes.  She was walking for five minutes . Pain Assessment Pain Score:   6 (Highest is an 8; lowest is a 0)   Objective: LLE Strength Left Hip Flexion: 5/5 was 3/5 Left Hip Extension: 3+/5 was 3-/5 Left Hip ABduction: 3/5 was 3/5 Left Hip ADduction: 5/5 was 5/5 Left Knee Flexion: 5/5 was 5/5 Left Knee Extension: 5/5 was 5/5 Left Ankle Dorsiflexion: 5/5 was 4/5  Exercise/Treatments For stability, stretching per doc flow sheet.  Modalities Modalities: Traction Traction Type of Traction: Lumbar Min (lbs): 50 Max (lbs): 70 Hold Time: 45 Rest Time: 15 Time: 12  Physical Therapy Assessment and Plan PT Assessment and Plan Clinical Impression Statement: Pt has improved in strength but still has some deficits; pain and functional status has not changed.  Tried trial of pelvic traction.  If patient feels this has relieved some of her pain she will ask the MD to continue so we can try two weeks of traction.  If traction does not help pt's pain pt will be discharged to work on a HEP. PT Plan: assess how traction helps pt.    Goals Home Exercise Program PT Goal: Perform Home  Exercise Program - Progress: Partly met PT Short Term Goals PT Short Term Goal 1 - Progress: Not met PT Short Term Goal 2 - Progress: Met PT Short Term Goal 3 - Progress: Met  Problem List Patient Active Problem List  Diagnoses  . Left leg weakness    PT - End of Session Activity Tolerance: Patient tolerated treatment well General Behavior During Session: [facility] for tasks performed   RUSSELL,CINDY 06/23/2011, 2:41 PM  Physician Documentation Your signature is required to indicate approval of the treatment plan as stated above.  Please sign and either send electronically or make a copy of this report for your files and return this physician signed original.   Please mark one 1.__approve of plan  2. ___approve of plan with the following conditions.   ______________________________                                                          _____________________ Physician Signature  Date  

## 2011-08-29 ENCOUNTER — Other Ambulatory Visit (HOSPITAL_COMMUNITY): Payer: Self-pay | Admitting: Family Medicine

## 2011-08-29 DIAGNOSIS — Z139 Encounter for screening, unspecified: Secondary | ICD-10-CM

## 2011-09-08 ENCOUNTER — Emergency Department (HOSPITAL_COMMUNITY)
Admission: EM | Admit: 2011-09-08 | Discharge: 2011-09-08 | Disposition: A | Discharge status: Home / Self Care | Payer: Medicare Other | Attending: Emergency Medicine | Admitting: Emergency Medicine

## 2011-09-08 ENCOUNTER — Encounter (HOSPITAL_COMMUNITY): Payer: Self-pay

## 2011-09-08 ENCOUNTER — Emergency Department (HOSPITAL_COMMUNITY): Payer: Medicare Other

## 2011-09-08 DIAGNOSIS — I10 Essential (primary) hypertension: Secondary | ICD-10-CM | POA: Insufficient documentation

## 2011-09-08 DIAGNOSIS — M25579 Pain in unspecified ankle and joints of unspecified foot: Secondary | ICD-10-CM | POA: Insufficient documentation

## 2011-09-08 DIAGNOSIS — E119 Type 2 diabetes mellitus without complications: Secondary | ICD-10-CM | POA: Insufficient documentation

## 2011-09-08 DIAGNOSIS — M109 Gout, unspecified: Secondary | ICD-10-CM | POA: Insufficient documentation

## 2011-09-08 HISTORY — DX: Abnormal results of liver function studies: R94.5

## 2011-09-08 HISTORY — DX: Essential (primary) hypertension: I10

## 2011-09-08 HISTORY — DX: Other specified abnormal findings of blood chemistry: R79.89

## 2011-09-08 MED ORDER — COLCHICINE 0.6 MG PO TABS
0.6000 mg | ORAL_TABLET | Freq: Once | ORAL | Status: AC
  Administered 2011-09-08: 0.6 mg via ORAL
  Filled 2011-09-08: qty 1

## 2011-09-08 MED ORDER — INDOMETHACIN 25 MG PO CAPS: ORAL_CAPSULE | ORAL | Status: DC

## 2011-09-08 MED ORDER — COLCHICINE 0.6 MG PO TABS: 0.6000 mg | ORAL_TABLET | Freq: Two times a day (BID) | ORAL | Status: DC

## 2011-09-08 MED ORDER — KETOROLAC TROMETHAMINE 30 MG/ML IJ SOLN
30.0000 mg | Freq: Once | INTRAMUSCULAR | Status: AC
  Administered 2011-09-08: 30 mg via INTRAMUSCULAR
  Filled 2011-09-08: qty 1

## 2011-09-08 MED ORDER — ONDANSETRON HCL 4 MG PO TABS: 4.0000 mg | ORAL_TABLET | Freq: Four times a day (QID) | ORAL | Status: AC

## 2011-09-08 NOTE — Discharge Instructions (Signed)
Elevate your foot. Take the medications prescribed today. You also already have some pain medication (hydrocodone) you can take. Use the zofran for nausea or vomiting. Have Dr Parke Simmers recheck you if you aren't improving over the weekend.

## 2011-09-08 NOTE — ED Provider Notes (Signed)
History     CSN: [id number]  Arrival date & time 09/08/11  1145   First MD Initiated Contact with Patient 09/08/11 1300      Chief Complaint  Patient presents with  . Ankle Pain  . Foot Pain    (Consider location/radiation/quality/duration/timing/severity/associated sxs/prior treatment) HPI  Patient has a history of gout. She relates her last episode was several years ago. She relates she started getting pain in her left ankle about 2 days ago. She denies any injury. She states she's had her gout in the past and it's usually in her feet. She relates today she tried to take her pain medication but she had nausea and vomited her medications up. She denies any abdominal pain now. She also denies any diarrhea or fever.  PCP Dr. Alvan Dame plan  Past Medical History  Diagnosis Date  . Gout   . Hypertension   . Elevated LFTs    diabetes  Past Surgical History  Procedure Date  . Rotator cuff repair   . Cesarean section     No family history on file.  History  Substance Use Topics  . Smoking status: Never Smoker   . Smokeless tobacco: Not on file  . Alcohol Use: No  lives at home with spo  OB History    Grav Para Term Preterm Abortions TAB SAB Ect Mult Living                  Review of Systems  All other systems reviewed and are negative.    Allergies  Review of patient's allergies indicates no known allergies.  Home Medications   Patient's Medications  Previous Medications   AMLODIPINE-VALSARTAN (EXFORGE) 10-320 MG PER TABLET    Take 1 tablet by mouth daily.   ETODOLAC (LODINE XL) 400 MG 24 HR TABLET    Take 400 mg by mouth 2 (two) times daily.   HYDROCODONE-ACETAMINOPHEN (NORCO) 10-325 MG PER TABLET    Take 1 tablet by mouth every 8 (eight) hours as needed. pain   METFORMIN (GLUCOPHAGE) 500 MG TABLET    Take 500 mg by mouth daily.   METOLAZONE (ZAROXOLYN) 2.5 MG TABLET    Take 2.5 mg by mouth daily.   OMEPRAZOLE (PRILOSEC) 20 MG CAPSULE    Take 20 mg by mouth  daily.   URSODIOL (ACTIGALL) 300 MG CAPSULE    Take 600 mg by mouth 2 (two) times daily.   VITAMIN D, ERGOCALCIFEROL, (DRISDOL) 50000 UNITS CAPS    Take 50,000 Units by mouth every 7 (seven) days. Patient takes on Thursday  ASA 81 mg daily    BP 115/96  Pulse 99  Temp(Src) 97.9 F (36.6 C) (Oral)  Resp 18  Ht 5\' 6"  (1.676 m)  Wt 150 lb (68.04 kg)  BMI 24.21 kg/m2  SpO2 98%  Vital signs normal    Physical Exam  Nursing note and vitals reviewed. Constitutional: She is oriented to person, place, and time. She appears well-developed and well-nourished.  Non-toxic appearance. She does not appear ill. No distress.  HENT:  Head: Normocephalic and atraumatic.  Right Ear: External ear normal.  Left Ear: External ear normal.  Nose: Nose normal. No mucosal edema or rhinorrhea.  Mouth/Throat: Mucous membranes are normal. No dental abscesses or uvula swelling.  Eyes: Conjunctivae and EOM are normal. Pupils are equal, round, and reactive to light.  Neck: Full passive range of motion without pain.  Pulmonary/Chest: Effort normal. She has no rhonchi. She exhibits no crepitus.  Abdominal: Normal  appearance.  Musculoskeletal: She exhibits edema and tenderness.       Patient noted to have diffuse swelling over her lateral malleolus and on the proximal lateral aspect of her left foot. There is mild warmth without erythema of the skin. There's no open wounds on the skin.   Neurological: She is alert and oriented to person, place, and time. She has normal strength. No cranial nerve deficit.  Skin: Skin is warm, dry and intact. No rash noted. No erythema. No pallor.  Psychiatric: She has a normal mood and affect. Her speech is normal and behavior is normal. Her mood appears not anxious.    ED Course  Procedures (including critical care time)  Review of patient's prior records, in September of 2012 she had a uric acid of 8.0 and a normal creatinine. Patient has a bottle of dexamethasone 4 mg  tablets that she was prescribed in October that she takes when necessary arthritis pain. She still has one pill left and only had 10 dispensed. Relates she tried to take a half pelvis morning and that is what she vomited. She also states she thinks she's been on allopurinol in the past she cannot recall.  She given Toradol 30 mg IM and given one colchicine orally  Labs Reviewed - No data to display Dg Ankle Complete Left  09/08/2011  *RADIOLOGY REPORT*  Clinical Data: Ankle pain and swelling for several days, no injury  LEFT ANKLE COMPLETE - 3+ VIEW  Comparison: None.  Findings: No acute fracture is seen.  The ankle joint appears normal.  There is mild soft tissue swelling laterally.  IMPRESSION: No acute abnormality.  Original Report Authenticated By: Juline Patch, M.D.     1. Acute gouty arthritis     New Prescriptions   COLCHICINE (COLCRYS) 0.6 MG TABLET    Take 1 tablet (0.6 mg total) by mouth 2 (two) times daily.   INDOMETHACIN (INDOCIN) 25 MG CAPSULE    Take 1 po QID x 3d then 1 po TID x 3d then 1 po BID x 3 days then 1 po QD x 3 days   ONDANSETRON (ZOFRAN) 4 MG TABLET    Take 1 tablet (4 mg total) by mouth every 6 (six) hours.   Plan discharge  Devoria Albe, MD, FACEP   MDM          Ward Givens, MD 09/08/11 [phone]

## 2011-09-08 NOTE — ED Notes (Signed)
Pt presents with left sided ankle pain and foot pain. Pt denies injury but states she has a history of gout.

## 2011-09-11 ENCOUNTER — Ambulatory Visit (HOSPITAL_COMMUNITY)
Admission: RE | Admit: 2011-09-11 | Discharge: 2011-09-11 | Disposition: A | Discharge status: Home / Self Care | Payer: Medicare Other | Source: Ambulatory Visit | Attending: Family Medicine | Admitting: Family Medicine

## 2011-09-11 DIAGNOSIS — Z139 Encounter for screening, unspecified: Secondary | ICD-10-CM

## 2011-09-11 DIAGNOSIS — Z1231 Encounter for screening mammogram for malignant neoplasm of breast: Secondary | ICD-10-CM | POA: Insufficient documentation

## 2011-12-08 ENCOUNTER — Encounter (HOSPITAL_COMMUNITY): Payer: Self-pay | Admitting: Pharmacy Technician

## 2011-12-11 ENCOUNTER — Other Ambulatory Visit: Payer: Self-pay | Admitting: Orthopedic Surgery

## 2011-12-12 ENCOUNTER — Encounter (HOSPITAL_COMMUNITY)
Admission: RE | Admit: 2011-12-12 | Discharge: 2011-12-12 | Disposition: A | Discharge status: Home / Self Care | Payer: Medicare Other | Source: Ambulatory Visit | Attending: Orthopedic Surgery | Admitting: Orthopedic Surgery

## 2011-12-12 ENCOUNTER — Inpatient Hospital Stay (HOSPITAL_COMMUNITY): Admission: RE | Admit: 2011-12-12 | Discharge: 2011-12-12 | Payer: Medicare Other | Source: Ambulatory Visit

## 2011-12-12 ENCOUNTER — Encounter (HOSPITAL_COMMUNITY): Payer: Self-pay

## 2011-12-12 HISTORY — DX: Gastro-esophageal reflux disease without esophagitis: K21.9

## 2011-12-12 HISTORY — DX: Unspecified osteoarthritis, unspecified site: M19.90

## 2011-12-12 LAB — COMPREHENSIVE METABOLIC PANEL
ALT: 41 U/L — ABNORMAL HIGH (ref 0–35)
CO2: 25 mEq/L (ref 19–32)
Calcium: 10.5 mg/dL (ref 8.4–10.5)
Creatinine, Ser: 0.99 mg/dL (ref 0.50–1.10)
GFR calc Af Amer: 64 mL/min — ABNORMAL LOW (ref >90)
GFR calc non Af Amer: 55 mL/min — ABNORMAL LOW (ref >90)
Glucose, Bld: 138 mg/dL — ABNORMAL HIGH (ref 70–99)
Sodium: 134 mEq/L — ABNORMAL LOW (ref 135–145)

## 2011-12-12 LAB — CBC
HCT: 40.1 % (ref 36.0–46.0)
Hemoglobin: 14 g/dL (ref 12.0–15.0)
MCH: 32.2 pg (ref 26.0–34.0)
MCV: 92.2 fL (ref 78.0–100.0)
RBC: 4.35 MIL/uL (ref 3.87–5.11)

## 2011-12-12 LAB — URINE MICROSCOPIC-ADD ON

## 2011-12-12 LAB — URINALYSIS, ROUTINE W REFLEX MICROSCOPIC
Bilirubin Urine: NEGATIVE
Leukocytes, UA: NEGATIVE
Protein, ur: NEGATIVE mg/dL
Specific Gravity, Urine: 1.01 (ref 1.005–1.030)

## 2011-12-12 LAB — SURGICAL PCR SCREEN: MRSA, PCR: NEGATIVE

## 2011-12-12 MED ORDER — CHLORHEXIDINE GLUCONATE 4 % EX LIQD: 60.0000 mL | Freq: Once | CUTANEOUS | Status: DC

## 2011-12-12 NOTE — Pre-Procedure Instructions (Signed)
20 KYMONI LESPERANCE  12/12/2011   Your procedure is scheduled on: Fri, May 31 @ 7:30 AM  Report to [facility] at 5:30 AM.  Call this number if you have problems the morning of surgery: [phone]   Remember:   Do not eat food:After Midnight.  May have clear liquids: up to 4 Hours before arrival.(until 1:30 AM)  Clear liquids include soda, tea, black coffee, apple or grape juice, broth,water  Take these medicines the morning of surgery with A SIP OF WATER: Amlodipine and Prilosec   Do not wear jewelry, make-up or nail polish.  Do not wear lotions, powders, or perfumes.   Do not shave 48 hours prior to surgery.   Do not bring valuables to the hospital.  Contacts, dentures or bridgework may not be worn into surgery.  Leave suitcase in the car. After surgery it may be brought to your room.  For patients admitted to the hospital, checkout time is 11:00 AM the day of discharge.   Patients discharged the day of surgery will not be allowed to drive home.  Special Instructions: CHG Shower Use Special Wash: 1/2 bottle night before surgery and 1/2 bottle morning of surgery.   Please read over the following fact sheets that you were given: Pain Booklet, Coughing and Deep Breathing, MRSA Information and Surgical Site Infection Prevention

## 2011-12-12 NOTE — Pre-Procedure Instructions (Signed)
20 TENIYA FILTER  12/12/2011   Your procedure is scheduled on:  12/22/11  Report to [facility] at 530 AM.  Call this number if you have problems the morning of surgery: [phone]   Remember:   Do not eat food:After Midnight.  May have clear liquids: up to 4 Hours before arrival.  Clear liquids include soda, tea, black coffee, apple or grape juice, broth.  Take these medicines the morning of surgery with A SIP OF WATER: prilosec,actigall  Do not wear jewelry, make-up or nail polish.  Do not wear lotions, powders, or perfumes. You may wear deodorant.  Do not shave 48 hours prior to surgery. Men may shave face and neck.  Do not bring valuables to the hospital.  Contacts, dentures or bridgework may not be worn into surgery.  Leave suitcase in the car. After surgery it may be brought to your room.  For patients admitted to the hospital, checkout time is 11:00 AM the day of discharge.   Patients discharged the day of surgery will not be allowed to drive home.  Name and phone number of your driver: [email or web address]  Special Instructions: CHG Shower Use Special Wash: 1/2 bottle night before surgery and 1/2 bottle morning of surgery.   Please read over the following fact sheets that you were given: Pain Booklet, Coughing and Deep Breathing, MRSA Information and Surgical Site Infection Prevention

## 2011-12-13 NOTE — Consult Note (Signed)
Anesthesia Chart Review:  Patient is a 71 year old female scheduled for right CTR on 12/22/11.  History includes DM2, HTN, GERD, gout, non-smoker, history of elevated LFTs, arthritis.  She is also on Decadron 2mg  daily PRN arthritis flare up.  CXR on 12/12/11 showed no active disease.  EKG from 12/12/11 showed NSR, minimal voltage criteria for LVH.  It was not felt significantly changed from her EKG on 03/24/09.  Labs noted.  K 3.0, Cr 0.99, WBC 10.9, glucose 138.  ALT 41, AST 28.    Plan to proceed if no acute change in her status.  Shonna Chock, PA-C

## 2011-12-21 MED ORDER — CEFAZOLIN SODIUM 1-5 GM-% IV SOLN: 1.0000 g | INTRAVENOUS | Status: DC

## 2011-12-22 ENCOUNTER — Encounter (HOSPITAL_COMMUNITY): Payer: Self-pay | Admitting: Vascular Surgery

## 2011-12-22 ENCOUNTER — Ambulatory Visit (HOSPITAL_COMMUNITY)
Admission: RE | Admit: 2011-12-22 | Discharge: 2011-12-22 | Disposition: A | Discharge status: Home / Self Care | Payer: Medicare Other | Source: Ambulatory Visit | Attending: Orthopedic Surgery | Admitting: Orthopedic Surgery

## 2011-12-22 ENCOUNTER — Encounter (HOSPITAL_COMMUNITY): Payer: Self-pay | Admitting: *Deleted

## 2011-12-22 ENCOUNTER — Encounter (HOSPITAL_COMMUNITY)
Admission: RE | Disposition: A | Discharge status: Home / Self Care | Payer: Self-pay | Source: Ambulatory Visit | Attending: Orthopedic Surgery

## 2011-12-22 ENCOUNTER — Ambulatory Visit (HOSPITAL_COMMUNITY): Payer: Medicare Other | Admitting: Vascular Surgery

## 2011-12-22 DIAGNOSIS — Z01810 Encounter for preprocedural cardiovascular examination: Secondary | ICD-10-CM | POA: Insufficient documentation

## 2011-12-22 DIAGNOSIS — Z01818 Encounter for other preprocedural examination: Secondary | ICD-10-CM | POA: Insufficient documentation

## 2011-12-22 DIAGNOSIS — M129 Arthropathy, unspecified: Secondary | ICD-10-CM | POA: Insufficient documentation

## 2011-12-22 DIAGNOSIS — G8918 Other acute postprocedural pain: Secondary | ICD-10-CM

## 2011-12-22 DIAGNOSIS — Z01812 Encounter for preprocedural laboratory examination: Secondary | ICD-10-CM | POA: Insufficient documentation

## 2011-12-22 DIAGNOSIS — E119 Type 2 diabetes mellitus without complications: Secondary | ICD-10-CM | POA: Insufficient documentation

## 2011-12-22 DIAGNOSIS — G5600 Carpal tunnel syndrome, unspecified upper limb: Secondary | ICD-10-CM | POA: Insufficient documentation

## 2011-12-22 DIAGNOSIS — K219 Gastro-esophageal reflux disease without esophagitis: Secondary | ICD-10-CM | POA: Insufficient documentation

## 2011-12-22 DIAGNOSIS — I10 Essential (primary) hypertension: Secondary | ICD-10-CM | POA: Insufficient documentation

## 2011-12-22 HISTORY — PX: CARPAL TUNNEL RELEASE: SHX101

## 2011-12-22 LAB — GLUCOSE, CAPILLARY: Glucose-Capillary: 85 mg/dL (ref 70–99)

## 2011-12-22 SURGERY — CARPAL TUNNEL RELEASE
Anesthesia: General | Site: Wrist | Laterality: Right | Wound class: Clean

## 2011-12-22 MED ORDER — CEFAZOLIN SODIUM 1-5 GM-% IV SOLN
INTRAVENOUS | Status: DC | PRN
  Administered 2011-12-22: 1 g via INTRAVENOUS

## 2011-12-22 MED ORDER — LACTATED RINGERS IV SOLN
INTRAVENOUS | Status: DC | PRN
  Administered 2011-12-22: 07:00:00 via INTRAVENOUS

## 2011-12-22 MED ORDER — HYDROMORPHONE HCL PF 1 MG/ML IJ SOLN: 0.2500 mg | INTRAMUSCULAR | Status: DC | PRN

## 2011-12-22 MED ORDER — 0.9 % SODIUM CHLORIDE (POUR BTL) OPTIME
TOPICAL | Status: DC | PRN
  Administered 2011-12-22: 1000 mL

## 2011-12-22 MED ORDER — CEFAZOLIN SODIUM 1-5 GM-% IV SOLN
INTRAVENOUS | Status: AC
  Filled 2011-12-22: qty 50

## 2011-12-22 MED ORDER — ONDANSETRON HCL 4 MG/2ML IJ SOLN
INTRAMUSCULAR | Status: DC | PRN
  Administered 2011-12-22: 4 mg via INTRAVENOUS

## 2011-12-22 MED ORDER — FENTANYL CITRATE 0.05 MG/ML IJ SOLN
INTRAMUSCULAR | Status: DC | PRN
  Administered 2011-12-22 (×2): 50 ug via INTRAVENOUS

## 2011-12-22 MED ORDER — DROPERIDOL 2.5 MG/ML IJ SOLN: 0.6250 mg | INTRAMUSCULAR | Status: DC | PRN

## 2011-12-22 MED ORDER — LIDOCAINE HCL (PF) 1 % IJ SOLN
INTRAMUSCULAR | Status: DC | PRN
  Administered 2011-12-22: 50 mL

## 2011-12-22 MED ORDER — OXYCODONE-ACETAMINOPHEN 7.5-325 MG PO TABS: 1.0000 | ORAL_TABLET | ORAL | Status: AC | PRN

## 2011-12-22 MED ORDER — PROPOFOL 10 MG/ML IV EMUL
INTRAVENOUS | Status: DC | PRN
  Administered 2011-12-22: 140 mg via INTRAVENOUS

## 2011-12-22 SURGICAL SUPPLY — 36 items
BANDAGE ELASTIC 4 VELCRO ST LF (GAUZE/BANDAGES/DRESSINGS) ×2 IMPLANT
BANDAGE GAUZE ELAST BULKY 4 IN (GAUZE/BANDAGES/DRESSINGS) ×2 IMPLANT
BNDG ESMARK 4X9 LF (GAUZE/BANDAGES/DRESSINGS) ×2 IMPLANT
CLOTH BEACON ORANGE TIMEOUT ST (SAFETY) ×2 IMPLANT
COVER SURGICAL LIGHT HANDLE (MISCELLANEOUS) ×2 IMPLANT
CUFF TOURNIQUET SINGLE 18IN (TOURNIQUET CUFF) ×2 IMPLANT
CUFF TOURNIQUET SINGLE 24IN (TOURNIQUET CUFF) IMPLANT
DRAPE U-SHAPE 47X51 STRL (DRAPES) ×2 IMPLANT
DRSG EMULSION OIL 3X3 NADH (GAUZE/BANDAGES/DRESSINGS) ×2 IMPLANT
ELECT REM PT RETURN 9FT ADLT (ELECTROSURGICAL) ×2
ELECTRODE REM PT RTRN 9FT ADLT (ELECTROSURGICAL) ×1 IMPLANT
GLOVE BIOGEL PI IND STRL 6.5 (GLOVE) ×1 IMPLANT
GLOVE BIOGEL PI IND STRL 7.5 (GLOVE) ×1 IMPLANT
GLOVE BIOGEL PI INDICATOR 6.5 (GLOVE) ×1
GLOVE BIOGEL PI INDICATOR 7.5 (GLOVE) ×1
GLOVE ECLIPSE 6.5 STRL STRAW (GLOVE) ×2 IMPLANT
GLOVE SS PI 9.0 STRL (GLOVE) ×2 IMPLANT
GLOVE SURG SS PI 7.0 STRL IVOR (GLOVE) ×2 IMPLANT
GOWN PREVENTION PLUS XLARGE (GOWN DISPOSABLE) IMPLANT
GOWN STRL NON-REIN LRG LVL3 (GOWN DISPOSABLE) IMPLANT
KIT BASIN OR (CUSTOM PROCEDURE TRAY) ×2 IMPLANT
KIT ROOM TURNOVER OR (KITS) ×2 IMPLANT
NEEDLE HYPO 25GX1X1/2 BEV (NEEDLE) ×2 IMPLANT
NS IRRIG 1000ML POUR BTL (IV SOLUTION) ×2 IMPLANT
PACK ORTHO EXTREMITY (CUSTOM PROCEDURE TRAY) ×2 IMPLANT
PAD ARMBOARD 7.5X6 YLW CONV (MISCELLANEOUS) ×4 IMPLANT
SPONGE GAUZE 4X4 12PLY (GAUZE/BANDAGES/DRESSINGS) ×2 IMPLANT
SPONGE LAP 4X18 X RAY DECT (DISPOSABLE) ×2 IMPLANT
SUCTION FRAZIER TIP 10 FR DISP (SUCTIONS) IMPLANT
SUT ETHILON 4 0 PS 2 18 (SUTURE) ×2 IMPLANT
SYR CONTROL 10ML LL (SYRINGE) ×2 IMPLANT
TOWEL OR 17X24 6PK STRL BLUE (TOWEL DISPOSABLE) IMPLANT
TOWEL OR 17X26 10 PK STRL BLUE (TOWEL DISPOSABLE) IMPLANT
TUBE CONNECTING 12X1/4 (SUCTIONS) IMPLANT
UNDERPAD 30X30 INCONTINENT (UNDERPADS AND DIAPERS) ×2 IMPLANT
WATER STERILE IRR 1000ML POUR (IV SOLUTION) ×2 IMPLANT

## 2011-12-22 NOTE — Brief Op Note (Signed)
12/22/2011  8:35 AM  PATIENT:  Leslie Berry  71 y.o. female  PRE-OPERATIVE DIAGNOSIS:  Carpal tunnel right wrist  POST-OPERATIVE DIAGNOSIS:  Carpal tunnel right wrist  PROCEDURE:  Procedure(s) (LRB): CARPAL TUNNEL RELEASE (Right)  SURGEON:  Surgeon(s) and Role:    * Kennieth Rad, MD - Primary  PHYSICIAN ASSISTANT:   ASSISTANTS: none   ANESTHESIA:   general  EBL:     BLOOD ADMINISTERED:none  DRAINS: none   LOCAL MEDICATIONS USED:  MARCAINE     SPECIMEN:  No Specimen  DISPOSITION OF SPECIMEN:  N/A  COUNTS:  YES  TOURNIQUET:   Total Tourniquet Time Documented: Upper Arm (Right) - 23 minutes  DICTATION: .Other Dictation: Dictation Number [id number]  PLAN OF CARE: Discharge to home after PACU  PATIENT DISPOSITION:  PACU - hemodynamically stable.   Delay start of Pharmacological VTE agent (>24hrs) due to surgical blood loss or risk of bleeding: N/A

## 2011-12-22 NOTE — Anesthesia Procedure Notes (Signed)
Procedure Name: LMA Insertion Date/Time: 12/22/2011 7:55 AM Performed by: Glendora Score A Pre-anesthesia Checklist: Patient identified, Emergency Drugs available, Suction available and Patient being monitored Patient Re-evaluated:Patient Re-evaluated prior to inductionOxygen Delivery Method: Circle system utilized Preoxygenation: Pre-oxygenation with 100% oxygen Intubation Type: IV induction LMA: LMA with gastric port inserted LMA Size: 4.0 Number of attempts: 1 Placement Confirmation: positive ETCO2 and breath sounds checked- equal and bilateral Tube secured with: Tape Dental Injury: Teeth and Oropharynx as per pre-operative assessment

## 2011-12-22 NOTE — Anesthesia Preprocedure Evaluation (Addendum)
Anesthesia Evaluation  Patient identified by MRN, date of birth, ID band Patient awake    Reviewed: Allergy & Precautions, H&P , NPO status , Patient's Chart, lab work & pertinent test results  Airway Mallampati: I      Dental  (+) Teeth Intact   Pulmonary  breath sounds clear to auscultation  Pulmonary exam normal       Cardiovascular hypertension, Pt. on medications Rhythm:Regular Rate:Normal     Neuro/Psych    GI/Hepatic GERD-  Medicated,  Endo/Other  Diabetes mellitus-, Type 2  Renal/GU      Musculoskeletal   Abdominal   Peds  Hematology   Anesthesia Other Findings   Reproductive/Obstetrics                          Anesthesia Physical Anesthesia Plan  ASA: III  Anesthesia Plan: General   Post-op Pain Management:    Induction: Intravenous  Airway Management Planned: LMA  Additional Equipment:   Intra-op Plan:   Post-operative Plan: Extubation in OR  Informed Consent: I have reviewed the patients History and Physical, chart, labs and discussed the procedure including the risks, benefits and alternatives for the proposed anesthesia with the patient or authorized representative who has indicated his/her understanding and acceptance.   Dental advisory given  Plan Discussed with: CRNA, Anesthesiologist and Surgeon  Anesthesia Plan Comments:         Anesthesia Quick Evaluation

## 2011-12-22 NOTE — H&P (Signed)
Leslie Berry is an 71 y.o. female.   Chief Complaint: PAIN AND NUMBNESS RIGHT HAND HPI: THIS IS A 71Y/O FEMALE FOLLOWED FOR CARPAL TUNNEL SYNDROME WITH PROGRESSIVE WEAKNESS ,PAIN AND LOSS OF GRIP IN THE RIGHT HAND.  Past Medical History  Diagnosis Date  . Gout   . Elevated LFTs   . Hypertension     dr bland  pcp  . Diabetes mellitus   . GERD (gastroesophageal reflux disease)   . Arthritis     Past Surgical History  Procedure Date  . Rotator cuff repair   . Cesarean section     Family History  Problem Relation Age of Onset  . Anesthesia problems Neg Hx    Social History:  reports that she has never smoked. She does not have any smokeless tobacco history on file. She reports that she does not drink alcohol or use illicit drugs.  Allergies: No Known Allergies  Medications Prior to Admission  Medication Sig Dispense Refill  . acetaminophen (TYLENOL) 500 MG tablet Take 1,000 mg by mouth every 12 (twelve) hours as needed. Arthritis      . amLODipine-valsartan (EXFORGE) 10-320 MG per tablet Take 1 tablet by mouth daily.      Marland Kitchen dexamethasone (DECADRON) 4 MG tablet Take 2 mg by mouth daily as needed. Takes if having an arthritis flare up      . metFORMIN (GLUCOPHAGE) 500 MG tablet Take 500 mg by mouth daily.      . metolazone (ZAROXOLYN) 2.5 MG tablet Take 2.5 mg by mouth daily.      Marland Kitchen omeprazole (PRILOSEC) 20 MG capsule Take 20 mg by mouth daily.      . ursodiol (ACTIGALL) 300 MG capsule Take 600 mg by mouth 2 (two) times daily.      . Vitamin D, Ergocalciferol, (DRISDOL) 50000 UNITS CAPS Take 50,000 Units by mouth every 7 (seven) days. Patient takes on Thursday        Results for orders placed during the hospital encounter of 12/22/11 (from the past 48 hour(s))  GLUCOSE, CAPILLARY     Status: Normal   Collection Time   12/22/11  6:20 AM      Component Value Range Comment   Glucose-Capillary 83  70 - 99 (mg/dL)    No results found.  Review of Systems  Constitutional:  Negative.   HENT: Negative.   Eyes: Negative.   Respiratory: Negative.   Cardiovascular: Negative.   Gastrointestinal: Negative.   Genitourinary: Negative.   Musculoskeletal: Negative for joint pain.  Skin: Negative.   Neurological: Negative for tingling and sensory change.  Endo/Heme/Allergies: Negative.   Psychiatric/Behavioral: Negative.     Blood pressure 107/75, pulse 56, temperature 97.6 F (36.4 C), temperature source Oral, resp. rate 18. Physical Exam RIGHT WRIST POSITIVE TINEL'S POSITIVE PHALEN'S TEST THENAR ATROPHY ,WEAKNESS OF GRIP AND PINCH.  Assessment/Plan RIGHT CARPAL TUNNEL SYNDROME /PLAN CARPAL TUNNEL RELEASE RIGHT WRIST.  Kennieth Rad 12/22/2011, 7:35 AM

## 2011-12-22 NOTE — Op Note (Signed)
Leslie Berry, HAAK NO.:  [id number]  MEDICAL RECORD NO.:  [id number]  LOCATION:  [facility]                         FACILITY:  [facility]  PHYSICIAN:  Myrtie Neither, MD      DATE OF BIRTH:  08-08-1940  DATE OF PROCEDURE:  12/22/2011 DATE OF DISCHARGE:                              OPERATIVE REPORT   PREOPERATIVE DIAGNOSIS:  Carpal tunnel syndrome, right wrist.  POSTOPERATIVE DIAGNOSIS:  Carpal tunnel syndrome, right wrist.  ANESTHESIA:  General.  PROCEDURE:  Right carpal tunnel release.  The patient was taken to the operating room.  After given adequate preop medications, given general anesthesia, and intubated.  Right forearm and wrist were prepped with DuraPrep and draped in sterile manner. Tourniquet and Bovie used for hemostasis.  A curvilinear incision was made over the volar wrist crease, going through skin and subcutaneous tissue.  The median nerve was identified proximally and protected with a Freer and release of the transverse carpal ligament was done all the way down to the bone.  The nerve itself was completely freed up.  Irrigation was done.  Wound closure was then done with 4-0 nylon.  Compressive dressing was applied.  The patient was given 7 mL of 0.25% plain Marcaine.  Cock-up wrist splint was applied in the recovery room.  The patient tolerated the procedure quite well and taken to the recovery room in stable and satisfactory condition.  The patient is being discharged home on Percocet 1-2 q.4 p.r.n. for pain, sling, ice packs, and elevation to return to the office in 1 week.  The patient is being discharged in stable and satisfactory condition.     Myrtie Neither, MD     AC/MEDQ  D:  12/22/2011  T:  12/22/2011  Job:  [id number]

## 2011-12-22 NOTE — Preoperative (Signed)
Beta Blockers   Reason not to administer Beta Blockers:Not Applicable 

## 2011-12-22 NOTE — Transfer of Care (Signed)
Immediate Anesthesia Transfer of Care Note  Patient: Leslie Berry  Procedure(s) Performed: Procedure(s) (LRB): CARPAL TUNNEL RELEASE (Right)  Patient Location: PACU  Anesthesia Type: General  Level of Consciousness: awake, alert , oriented and patient cooperative  Airway & Oxygen Therapy: Patient Spontanous Breathing and Patient connected to face mask oxygen  Post-op Assessment: Report given to PACU RN  Post vital signs: Reviewed and stable  Complications: No apparent anesthesia complications

## 2011-12-22 NOTE — Progress Notes (Signed)
Orthopedic Tech Progress Note Patient Details:  Leslie Berry 1940/08/18 [id number]  Ortho Devices Type of Ortho Device: Velcro wrist splint   Jodeci Roarty T 12/22/2011, 8:39 AM

## 2011-12-22 NOTE — Anesthesia Postprocedure Evaluation (Signed)
Anesthesia Post Note  Patient: Leslie Berry  Procedure(s) Performed: Procedure(s) (LRB): CARPAL TUNNEL RELEASE (Right)  Anesthesia type: general  Patient location: PACU  Post pain: Pain level controlled  Post assessment: Patient's Cardiovascular Status Stable  Last Vitals:  Filed Vitals:   12/22/11 0930  BP:   Pulse:   Temp: 36.7 C  Resp:     Post vital signs: Reviewed and stable  Level of consciousness: sedated  Complications: No apparent anesthesia complications

## 2011-12-22 NOTE — H&P (Signed)
NAMESHARDAE, KLEINMAN NO.:  [id number]  MEDICAL RECORD NO.:  [id number]  LOCATION:  [facility]                         FACILITY:  [facility]  PHYSICIAN:  Myrtie Neither, MD      DATE OF BIRTH:  28-Jan-1941  DATE OF ADMISSION:  12/22/2011 DATE OF DISCHARGE:                             HISTORY & PHYSICAL   CHIEF COMPLAINT:  Painful numbness and weakness in the right hand.  HISTORY OF PRESENT ILLNESS:  This is a 71 year old female, who is being followed in the office over the past year for carpal tunnel syndrome in both hands, right being worse than the left, with progressive weakness, loss of grip and dropping things in the right hand over the past few months.  The patient has been treated with night splints, therapeutic injections and anti-inflammatories without any relief.  Nerve conduction test demonstrates severe carpal tunnel syndrome right wrist.  PAST MEDICAL HISTORY: 1. Gout. 2. Hypertension. 3. Diabetes mellitus. 4. GERD. 5. Degenerative arthropathy.  ALLERGIES:  None known.  PAST SURGICAL HISTORY:  Rotator cuff repair, C section.  FAMILY HISTORY:  Noncontributory.  REVIEW OF SYSTEMS:  Basically that of history of present illness. Otherwise, joint pain shoulders, knees and lumbar.  No cardiac, respiratory, no urinary or bowel symptoms.  SOCIAL HISTORY:  Negative for use of alcohol, tobacco or illegal drugs.  MEDICATIONS: 1. Tylenol p.r.n. 2. Exforge 10/320 daily. 3. Decadron 4 mg p.r.n. 4. Metformin 500 mg daily. 5. Metolazone 2.5 mg daily. 6. Omeprazole 20 mg daily. 7. Ursodiol 300 mg 2 times daily. 8. Vitamin D.  IMAGING:  Nerve conduction tests demonstrates severe carpal tunnel right wrist.  IMPRESSION:  Carpal tunnel syndrome right wrist.  PLAN:  Carpal tunnel release right wrist.     Myrtie Neither, MD     AC/MEDQ  D:  12/22/2011  T:  12/22/2011  Job:  [id number]

## 2011-12-24 ENCOUNTER — Other Ambulatory Visit: Payer: Self-pay | Admitting: Orthopedic Surgery

## 2011-12-24 ENCOUNTER — Encounter (HOSPITAL_COMMUNITY): Payer: Self-pay | Admitting: Emergency Medicine

## 2011-12-24 ENCOUNTER — Observation Stay (HOSPITAL_COMMUNITY)
Admission: EM | Admit: 2011-12-24 | Discharge: 2011-12-25 | Disposition: A | Discharge status: Home / Self Care | Payer: Medicare Other | Attending: Orthopedic Surgery | Admitting: Orthopedic Surgery

## 2011-12-24 DIAGNOSIS — E119 Type 2 diabetes mellitus without complications: Secondary | ICD-10-CM | POA: Insufficient documentation

## 2011-12-24 DIAGNOSIS — K219 Gastro-esophageal reflux disease without esophagitis: Secondary | ICD-10-CM | POA: Insufficient documentation

## 2011-12-24 DIAGNOSIS — M1990 Unspecified osteoarthritis, unspecified site: Secondary | ICD-10-CM | POA: Insufficient documentation

## 2011-12-24 DIAGNOSIS — G8918 Other acute postprocedural pain: Principal | ICD-10-CM | POA: Insufficient documentation

## 2011-12-24 DIAGNOSIS — I10 Essential (primary) hypertension: Secondary | ICD-10-CM | POA: Insufficient documentation

## 2011-12-24 DIAGNOSIS — M25539 Pain in unspecified wrist: Secondary | ICD-10-CM | POA: Insufficient documentation

## 2011-12-24 DIAGNOSIS — M109 Gout, unspecified: Secondary | ICD-10-CM | POA: Insufficient documentation

## 2011-12-24 LAB — BASIC METABOLIC PANEL
CO2: 30 mEq/L (ref 19–32)
Calcium: 10.5 mg/dL (ref 8.4–10.5)
GFR calc Af Amer: >90 mL/min (ref >90)
Sodium: 136 mEq/L (ref 135–145)

## 2011-12-24 LAB — CBC
MCV: 94.1 fL (ref 78.0–100.0)
Platelets: 289 10*3/uL (ref 150–400)
RBC: 4.41 MIL/uL (ref 3.87–5.11)
RDW: 13.1 % (ref 11.5–15.5)
WBC: 13.3 10*3/uL — ABNORMAL HIGH (ref 4.0–10.5)

## 2011-12-24 LAB — DIFFERENTIAL
Basophils Absolute: 0 10*3/uL (ref 0.0–0.1)
Lymphocytes Relative: 10 % — ABNORMAL LOW (ref 12–46)
Lymphs Abs: 1.3 10*3/uL (ref 0.7–4.0)
Neutro Abs: 10.5 10*3/uL — ABNORMAL HIGH (ref 1.7–7.7)
Neutrophils Relative %: 79 % — ABNORMAL HIGH (ref 43–77)

## 2011-12-24 MED ORDER — SODIUM CHLORIDE 0.45 % IV SOLN
INTRAVENOUS | Status: DC
  Administered 2011-12-24 – 2011-12-25 (×2): via INTRAVENOUS

## 2011-12-24 MED ORDER — AMLODIPINE BESYLATE-VALSARTAN 10-320 MG PO TABS: 1.0000 | ORAL_TABLET | Freq: Every day | ORAL | Status: DC

## 2011-12-24 MED ORDER — METOLAZONE 2.5 MG PO TABS
2.5000 mg | ORAL_TABLET | Freq: Every day | ORAL | Status: DC
  Administered 2011-12-24 – 2011-12-25 (×2): 2.5 mg via ORAL
  Filled 2011-12-24 (×2): qty 1

## 2011-12-24 MED ORDER — SODIUM CHLORIDE 0.9 % IV BOLUS (SEPSIS): 500.0000 mL | Freq: Once | INTRAVENOUS | Status: DC

## 2011-12-24 MED ORDER — ONDANSETRON HCL 4 MG/2ML IJ SOLN
4.0000 mg | Freq: Once | INTRAMUSCULAR | Status: AC
  Administered 2011-12-24: 4 mg via INTRAVENOUS
  Filled 2011-12-24: qty 2

## 2011-12-24 MED ORDER — METFORMIN HCL 500 MG PO TABS
500.0000 mg | ORAL_TABLET | Freq: Every day | ORAL | Status: DC
  Filled 2011-12-24 (×2): qty 1

## 2011-12-24 MED ORDER — OXYCODONE-ACETAMINOPHEN 5-325 MG PO TABS: 1.0000 | ORAL_TABLET | ORAL | Status: DC | PRN

## 2011-12-24 MED ORDER — INSULIN ASPART 100 UNIT/ML ~~LOC~~ SOLN
0.0000 [IU] | Freq: Three times a day (TID) | SUBCUTANEOUS | Status: DC
  Administered 2011-12-24 – 2011-12-25 (×2): 2 [IU] via SUBCUTANEOUS

## 2011-12-24 MED ORDER — VITAMIN D (ERGOCALCIFEROL) 1.25 MG (50000 UNIT) PO CAPS: 50000.0000 [IU] | ORAL_CAPSULE | ORAL | Status: DC

## 2011-12-24 MED ORDER — HYDROMORPHONE HCL PF 1 MG/ML IJ SOLN: 1.0000 mg | Freq: Once | INTRAMUSCULAR | Status: DC

## 2011-12-24 MED ORDER — HYDROMORPHONE HCL PF 1 MG/ML IJ SOLN
1.0000 mg | INTRAMUSCULAR | Status: DC | PRN
  Administered 2011-12-24: 1 mg via INTRAVENOUS
  Filled 2011-12-24: qty 1

## 2011-12-24 MED ORDER — OXYCODONE HCL 5 MG PO TABS: 2.5000 mg | ORAL_TABLET | ORAL | Status: DC | PRN

## 2011-12-24 MED ORDER — FENTANYL CITRATE 0.05 MG/ML IJ SOLN
50.0000 ug | Freq: Once | INTRAMUSCULAR | Status: AC
  Administered 2011-12-24: 50 ug via INTRAVENOUS
  Filled 2011-12-24: qty 2

## 2011-12-24 MED ORDER — PANTOPRAZOLE SODIUM 40 MG PO TBEC
40.0000 mg | DELAYED_RELEASE_TABLET | Freq: Every day | ORAL | Status: DC
  Administered 2011-12-24 – 2011-12-25 (×2): 40 mg via ORAL
  Filled 2011-12-24 (×3): qty 1

## 2011-12-24 MED ORDER — ZOLPIDEM TARTRATE 5 MG PO TABS: 5.0000 mg | ORAL_TABLET | Freq: Every evening | ORAL | Status: DC | PRN

## 2011-12-24 MED ORDER — OXYCODONE-ACETAMINOPHEN 7.5-325 MG PO TABS: 1.0000 | ORAL_TABLET | ORAL | Status: DC | PRN

## 2011-12-24 MED ORDER — AMLODIPINE BESYLATE 10 MG PO TABS
10.0000 mg | ORAL_TABLET | Freq: Every day | ORAL | Status: DC
  Administered 2011-12-24 – 2011-12-25 (×2): 10 mg via ORAL
  Filled 2011-12-24 (×2): qty 1

## 2011-12-24 MED ORDER — ACETAMINOPHEN 500 MG PO TABS: 1000.0000 mg | ORAL_TABLET | Freq: Three times a day (TID) | ORAL | Status: DC | PRN

## 2011-12-24 MED ORDER — URSODIOL 300 MG PO CAPS
600.0000 mg | ORAL_CAPSULE | Freq: Two times a day (BID) | ORAL | Status: DC
  Administered 2011-12-24 – 2011-12-25 (×2): 600 mg via ORAL
  Filled 2011-12-24 (×3): qty 2

## 2011-12-24 MED ORDER — DEXAMETHASONE SODIUM PHOSPHATE 4 MG/ML IJ SOLN
4.0000 mg | Freq: Two times a day (BID) | INTRAMUSCULAR | Status: DC
  Administered 2011-12-24 – 2011-12-25 (×2): 4 mg via INTRAVENOUS
  Filled 2011-12-24 (×3): qty 1

## 2011-12-24 MED ORDER — INSULIN ASPART 100 UNIT/ML ~~LOC~~ SOLN
4.0000 [IU] | Freq: Three times a day (TID) | SUBCUTANEOUS | Status: DC
  Administered 2011-12-24 – 2011-12-25 (×3): 4 [IU] via SUBCUTANEOUS

## 2011-12-24 MED ORDER — ONDANSETRON HCL 4 MG/2ML IJ SOLN: 4.0000 mg | Freq: Four times a day (QID) | INTRAMUSCULAR | Status: DC | PRN

## 2011-12-24 MED ORDER — IRBESARTAN 300 MG PO TABS
300.0000 mg | ORAL_TABLET | Freq: Every day | ORAL | Status: DC
  Administered 2011-12-24 – 2011-12-25 (×2): 300 mg via ORAL
  Filled 2011-12-24 (×2): qty 1

## 2011-12-24 NOTE — ED Provider Notes (Signed)
History     CSN: [id number]  Arrival date & time 12/24/11  0754   First MD Initiated Contact with Patient 12/24/11 [phone]      Chief Complaint  Patient presents with  . Post-op Problem    Pt has carpel tunnel surgery on right hand Friday, experiencing Pain with no relief from Pain med. Dr. Montez Morita wants pt to come to ED and have MD paged     HPI Patient has complained of right wrist pain.  She recently had carpal tunnel surgery.  She has been unable to hold down her pain medication because of nausea and vomiting.  No fever.  According to patient Dr. Montez Morita is to see her in the ED. Past Medical History  Diagnosis Date  . Gout   . Elevated LFTs   . Hypertension     dr bland  pcp  . Diabetes mellitus   . GERD (gastroesophageal reflux disease)   . Arthritis     Past Surgical History  Procedure Date  . Rotator cuff repair   . Cesarean section   . Carpal tunnel release     Family History  Problem Relation Age of Onset  . Anesthesia problems Neg Hx     History  Substance Use Topics  . Smoking status: Never Smoker   . Smokeless tobacco: Not on file  . Alcohol Use: No    OB History    Grav Para Term Preterm Abortions TAB SAB Ect Mult Living                  Review of Systems  All other systems reviewed and are negative.    Allergies  Review of patient's allergies indicates no known allergies.  Home Medications   Current Outpatient Rx  Name Route Sig Dispense Refill  . ACETAMINOPHEN 500 MG PO TABS Oral Take 1,000 mg by mouth every 12 (twelve) hours as needed. Arthritis    . AMLODIPINE BESYLATE-VALSARTAN 10-320 MG PO TABS Oral Take 1 tablet by mouth daily.    Marland Kitchen DEXAMETHASONE 4 MG PO TABS Oral Take 2 mg by mouth daily as needed. Takes if having an arthritis flare up    . METFORMIN HCL 500 MG PO TABS Oral Take 500 mg by mouth daily.    Marland Kitchen METOLAZONE 2.5 MG PO TABS Oral Take 2.5 mg by mouth daily.    Marland Kitchen OMEPRAZOLE 20 MG PO CPDR Oral Take 20 mg by mouth daily.    .  OXYCODONE-ACETAMINOPHEN 7.5-325 MG PO TABS Oral Take 1 tablet by mouth every 4 (four) hours as needed for pain. 60 tablet 0  . URSODIOL 300 MG PO CAPS Oral Take 600 mg by mouth 2 (two) times daily.    Marland Kitchen VITAMIN D (ERGOCALCIFEROL) 50000 UNITS PO CAPS Oral Take 50,000 Units by mouth every 7 (seven) days. Patient takes on Thursday      BP 137/84  Pulse 103  Temp(Src) 99.2 F (37.3 C) (Oral)  Resp 20  SpO2 99%  Physical Exam  Nursing note and vitals reviewed. Constitutional: She is oriented to person, place, and time. She appears well-developed and well-nourished. No distress.  HENT:  Head: Normocephalic and atraumatic.  Eyes: Pupils are equal, round, and reactive to light.  Neck: Normal range of motion.  Cardiovascular: Normal rate and intact distal pulses.   Pulmonary/Chest: No respiratory distress.  Abdominal: Normal appearance. She exhibits no distension.  Musculoskeletal: Normal range of motion.       Arms: Neurological: She is alert and  oriented to person, place, and time. No cranial nerve deficit.  Skin: Skin is warm and dry. No rash noted.  Psychiatric: She has a normal mood and affect. Her behavior is normal.    ED Course  Procedures (including critical care time) Dr. Montez Morita came to the emergency department at [facility] long and evaluated patient.  Disposition will be as per his direction. Labs Reviewed  BASIC METABOLIC PANEL - Abnormal; Notable for the following:    Potassium 3.1 (*)    Chloride 94 (*)    Glucose, Bld 150 (*)    GFR calc non Af Amer 83 (*)    All other components within normal limits  CBC - Abnormal; Notable for the following:    WBC 13.3 (*)    All other components within normal limits  DIFFERENTIAL - Abnormal; Notable for the following:    Neutrophils Relative 79 (*)    Neutro Abs 10.5 (*)    Lymphocytes Relative 10 (*)    Monocytes Absolute 1.5 (*)    All other components within normal limits   No results found.   1. Post-operative pain        MDM         Nelia Shi, MD 12/24/11 1157

## 2011-12-24 NOTE — ED Notes (Signed)
Report called to Selena Batten, RN on 6th floor.

## 2011-12-24 NOTE — H&P (Signed)
NAMEJENNICE, Leslie Berry NO.:  [id number]  MEDICAL RECORD NO.:  [id number]  LOCATION:  WA05                         FACILITY:  [facility]  PHYSICIAN:  Myrtie Neither, MD      DATE OF BIRTH:  1940/12/03  DATE OF ADMISSION:  12/24/2011 DATE OF DISCHARGE:                             HISTORY & PHYSICAL   CHIEF COMPLAINT:  Severe pain in the right wrist after the surgery.  HISTORY OF PRESENT ILLNESS:  This is a 71 year old female who just recently had right carpal tunnel release on this past Friday.  The patient was discharged home in stable condition.  The patient did develop severe onset of pain on Saturday.  The patient was instructed to use 2 of her Percocet since that of 1 and took all back the following day.  The patient called this morning stating her pain is still severe.  PAST MEDICAL HISTORY:  History of diabetes mellitus, degenerative joint disease, carpal tunnel syndrome, bilateral wrists.  ALLERGIES:  None known.  MEDICATIONS:  Same as the discharge.  PAST MEDICAL HISTORY:  Same as last discharge as well.  PHYSICAL EXAMINATION:  VITAL SIGNS:  Temperature 99.2, pulse 103, respirations 20, blood pressure 137/84. __________ alert and in some distress with pain in the right wrist. HEAD:  Normocephalic.  Eyes, conjunctivae clear. CHEST:  Clear. CARDIAC:  S1, S2.  Regular. EXTREMITY:  Right wrist wound has moderate swelling.  Good finger movement.  Tenderness over the surgical scar.  No sign of infection.  No increased warmth or discoloration.  IMPRESSION:  Postoperative pain, status post carpal tunnel syndrome, right wrist.  PLAN:  Pain management, pain control, and the use of IV Decadron to reduce swelling.  The patient will be admitted 23-hour observation.    Myrtie Neither, MD    AC/MEDQ  D:  12/24/2011  T:  12/24/2011  Job:  [id number]

## 2011-12-24 NOTE — ED Notes (Signed)
Pt presenting to ed with c/o right wrist pain and finger pain s/p having carpal tunnel surgery x 2 days ago.

## 2011-12-24 NOTE — ED Notes (Signed)
Pt in with rt anterior wrist pain. Reports pain at 9/10. Reports having had carpal tunnel surgery on Thursday and has not been about to keep anything down since then. Reports vomiting even after taking in liquids. Site noted with with stitches, no redness or signs of infection. Resting quietly in bed with eyes opened.

## 2011-12-24 NOTE — ED Notes (Signed)
Pt assisted to bathroom. Voided.

## 2011-12-24 NOTE — ED Notes (Signed)
Pt also state right side of her face feels numb. Pt denies any chest pain, pt denies nausea and vomiting at this time

## 2011-12-25 ENCOUNTER — Encounter (HOSPITAL_COMMUNITY): Payer: Self-pay | Admitting: Orthopedic Surgery

## 2011-12-25 ENCOUNTER — Other Ambulatory Visit: Payer: Self-pay | Admitting: Orthopedic Surgery

## 2011-12-25 DIAGNOSIS — G8918 Other acute postprocedural pain: Secondary | ICD-10-CM

## 2011-12-25 LAB — GLUCOSE, CAPILLARY

## 2011-12-25 MED ORDER — DEXAMETHASONE SODIUM PHOSPHATE 4 MG/ML IJ SOLN: 4.0000 mg | Freq: Two times a day (BID) | INTRAMUSCULAR | Status: DC

## 2011-12-25 NOTE — Care Management Note (Signed)
    Page 1 of 2   12/25/2011     4:26:28 PM   CARE MANAGEMENT NOTE 12/25/2011  Patient:  Leslie Berry, Leslie Berry   Account Number:  [id number]  Date Initiated:  12/25/2011  Documentation initiated by:  Colleen Can  Subjective/Objective Assessment:   dx post op pain following surgery done on 05/31/ carpal tunnel release     Action/Plan:   Plans home with family   Anticipated DC Date:  12/25/2011   Anticipated DC Plan:  HOME/SELF CARE  In-house referral  NA      DC Planning Services  NA      [facility] Choice  NA   Choice offered to / List presented to:  NA   DME arranged  NA      DME agency  NA     HH arranged  NA      HH agency  NA   Status of service:  Completed, signed off Medicare Important Message given?  NA - LOS <3 / Initial given by admissions (If response is "NO", the following Medicare IM given date fields will be blank) Date Medicare IM given:   Date Additional Medicare IM given:    Discharge Disposition:  HOME/SELF CARE  Per UR Regulation:  Reviewed for med. necessity/level of care/duration of stay  If discussed at Long Length of Stay Meetings, dates discussed:    Comments:

## 2011-12-25 NOTE — Progress Notes (Signed)
Pt for d/c home today. IV d/c'd. Dressing CDI to R hand. Brace/splint in place. D/C instructions & Rx given with verbalized understanding. Awaiting for husband to pick up pt & assist with d/c. No changes in am assessments at this time. Denies pain to R hand, able to move fingers to R hand with slight limitation.

## 2011-12-25 NOTE — Progress Notes (Unsigned)
PATIENT'S PAIN IS UNDER CONTROL NOW WITH PERCOCET , THERE IS GOOD FINGER MOVEMENT , SWELLING HAS SUBSIDED, DISCHARGE RETURN TO OFFICE IN ONE WEEK .

## 2012-01-08 NOTE — Discharge Planning (Signed)
Report#[id number]

## 2012-01-09 NOTE — Discharge Summary (Signed)
Leslie Berry, Leslie Berry                     ACCOUNT NO.:  MEDICAL RECORD NO.:  [id number]  LOCATION:                                 FACILITY:  PHYSICIAN:  Myrtie Neither, MD      DATE OF BIRTH:  1941-02-03  DATE OF ADMISSION:  12/24/2011 DATE OF DISCHARGE:  01/05/2012                              DISCHARGE SUMMARY   ADMITTING DIAGNOSES:  Carpal tunnel syndrome, right wrist; history of diabetes mellitus; degenerative joint disease; history of gastroesophageal reflux disease; high blood pressure.  DISCHARGE DIAGNOSES:  Carpal tunnel syndrome, right wrist; history of diabetes mellitus; degenerative joint disease; history of gastroesophageal reflux disease; high blood pressure.  COMPLICATIONS:  None.  INFECTIONS:  None.  OPERATION:  Right carpal tunnel release done on December 24, 2011.  PERTINENT HISTORY:  This is a 71 year old female with bilateral carpal tunnel, right worse than left with progressive worsening over the past few months with loss of grip, difficulty holding on the things and dropping objects.  Persistent pain, both night and day.  The patient undergone therapeutic injection without any improvement as well as night splinting.  Nerve conduction test demonstrated severe carpal tunnel, right wrist.  PERTINENT PHYSICAL:  Right wrist with thenar atrophy and weak grip and pinch, hyperesthesia within the fingers in the median nerve distribution.  Positive Tinel's and positive Phalen's test.  HOSPITAL COURSE:  The patient underwent preop laboratory CBC, EKG, chest x-ray, CMET, UA.  The patient's lab was stable enough to undergo surgery.  The patient underwent right wrist carpal tunnel release, tolerated procedure quite well postoperatively. Pain was brought under control with use of Percocet, ice packs, elevation, use of sling.  The patient may be discharged home to continue Percocet 1-2 q.4 p.r.n. for pain, use a sling, ice packs, and to return in office in 1 week.   The patient was discharged.     Myrtie Neither, MD     AC/MEDQ  D:  01/08/2012  T:  01/09/2012  Job:  [id number]

## 2012-08-21 ENCOUNTER — Other Ambulatory Visit (HOSPITAL_COMMUNITY): Payer: Self-pay | Admitting: Family Medicine

## 2012-08-21 DIAGNOSIS — Z139 Encounter for screening, unspecified: Secondary | ICD-10-CM

## 2012-09-12 ENCOUNTER — Ambulatory Visit (HOSPITAL_COMMUNITY)
Admission: RE | Admit: 2012-09-12 | Discharge: 2012-09-12 | Disposition: A | Discharge status: Home / Self Care | Payer: Medicare Other | Source: Ambulatory Visit | Attending: Family Medicine | Admitting: Family Medicine

## 2012-09-12 DIAGNOSIS — Z139 Encounter for screening, unspecified: Secondary | ICD-10-CM

## 2012-09-12 DIAGNOSIS — Z1231 Encounter for screening mammogram for malignant neoplasm of breast: Secondary | ICD-10-CM | POA: Insufficient documentation

## 2013-05-14 ENCOUNTER — Encounter (INDEPENDENT_AMBULATORY_CARE_PROVIDER_SITE_OTHER): Payer: Self-pay

## 2013-05-14 ENCOUNTER — Other Ambulatory Visit (HOSPITAL_COMMUNITY)
Admission: RE | Admit: 2013-05-14 | Discharge: 2013-05-14 | Disposition: A | Discharge status: Home / Self Care | Payer: Medicare Other | Source: Ambulatory Visit | Attending: Obstetrics and Gynecology | Admitting: Obstetrics and Gynecology

## 2013-05-14 ENCOUNTER — Ambulatory Visit (INDEPENDENT_AMBULATORY_CARE_PROVIDER_SITE_OTHER): Payer: Medicare Other | Admitting: Obstetrics and Gynecology

## 2013-05-14 ENCOUNTER — Encounter: Payer: Self-pay | Admitting: Obstetrics and Gynecology

## 2013-05-14 VITALS — BP 132/80 | Ht 65.0 in | Wt 154.0 lb

## 2013-05-14 DIAGNOSIS — Z124 Encounter for screening for malignant neoplasm of cervix: Secondary | ICD-10-CM

## 2013-05-14 DIAGNOSIS — N898 Other specified noninflammatory disorders of vagina: Secondary | ICD-10-CM

## 2013-05-14 DIAGNOSIS — N760 Acute vaginitis: Secondary | ICD-10-CM

## 2013-05-14 DIAGNOSIS — Z1151 Encounter for screening for human papillomavirus (HPV): Secondary | ICD-10-CM | POA: Insufficient documentation

## 2013-05-14 DIAGNOSIS — Z01419 Encounter for gynecological examination (general) (routine) without abnormal findings: Secondary | ICD-10-CM | POA: Insufficient documentation

## 2013-05-14 MED ORDER — ESTROGENS, CONJUGATED 0.625 MG/GM VA CREA: TOPICAL_CREAM | Freq: Every day | VAGINAL | Status: DC

## 2013-05-14 NOTE — Patient Instructions (Signed)
Use premarin twice weekly , may leave pessary in place while using

## 2013-05-14 NOTE — Progress Notes (Signed)
Patient ID: Leslie Berry, female   DOB: Jun 11, 1941, 72 y.o.   MRN: [id number]    [facility] Visit  Patient name: Leslie Berry MRN [id number]  Date of birth: 12/23/1940  CC & HPI:  Leslie Berry is a 72 y.o. female presenting today for vag discharge. Pt states that she is having a yellowish discharge, had discharge for a couple months. Pt has a pessary and states that when she takes it out to clean it the discharge seems to get better but then comes back. Pt states she has some pain with removal and reinsertion of pessary but none any other time. Pessary removed 2 d ago.  ROS:  No recent pap.  Pertinent History Reviewed:  Medical & Surgical Hx:  Reviewed: Significant for n/a Medications: Reviewed & Updated - see associated section Social History: Reviewed -  reports that she quit smoking about 30 years ago. She has never used smokeless tobacco.  Objective Findings:  Vitals: BP 132/80  Ht 5\' 5"  (1.651 m)  Wt 154 lb (69.854 kg)  BMI 25.63 kg/m2  Physical Examination: General appearance - alert, well appearing, and in no distress and oriented to person, place, and time Mental status - alert, oriented to person, place, and time Abdomen - soft, nontender, nondistended, no masses or organomegaly Pelvic : Cystocele good support of cervix. Wet prep:   Assessment & Plan:   Atrophic vaginitis. Cystocele. Plan: Premarin v.c.

## 2013-08-18 ENCOUNTER — Other Ambulatory Visit (HOSPITAL_COMMUNITY): Payer: Self-pay | Admitting: Family Medicine

## 2013-08-18 DIAGNOSIS — Z139 Encounter for screening, unspecified: Secondary | ICD-10-CM

## 2013-09-15 ENCOUNTER — Ambulatory Visit (HOSPITAL_COMMUNITY)
Admission: RE | Admit: 2013-09-15 | Discharge: 2013-09-15 | Disposition: A | Discharge status: Home / Self Care | Payer: Medicare Other | Source: Ambulatory Visit | Attending: Family Medicine | Admitting: Family Medicine

## 2013-09-15 DIAGNOSIS — Z139 Encounter for screening, unspecified: Secondary | ICD-10-CM

## 2013-09-15 DIAGNOSIS — Z1231 Encounter for screening mammogram for malignant neoplasm of breast: Secondary | ICD-10-CM | POA: Insufficient documentation

## 2014-06-04 ENCOUNTER — Ambulatory Visit (INDEPENDENT_AMBULATORY_CARE_PROVIDER_SITE_OTHER): Payer: Medicare Other | Admitting: Obstetrics and Gynecology

## 2014-06-04 VITALS — BP 140/90 | Ht 65.0 in | Wt 160.0 lb

## 2014-06-04 DIAGNOSIS — N8110 Cystocele, unspecified: Secondary | ICD-10-CM

## 2014-06-04 DIAGNOSIS — IMO0002 Reserved for concepts with insufficient information to code with codable children: Secondary | ICD-10-CM | POA: Insufficient documentation

## 2014-06-04 DIAGNOSIS — N993 Prolapse of vaginal vault after hysterectomy: Secondary | ICD-10-CM | POA: Insufficient documentation

## 2014-06-04 NOTE — Progress Notes (Signed)
Patient ID: Leslie Berry, female   DOB: 19-Jul-1941, 73 y.o.   MRN: [id number] Pt here today to check her pessary. Pt states that the pessary will not stay up, she is unsure if it is worn or if something has changed down there.

## 2014-06-04 NOTE — Progress Notes (Signed)
Patient ID: Leslie Berry, female   DOB: 1941/06/19, 73 y.o.   MRN: [id number]   [facility]  Patient name: Leslie Berry MRN [id number]  Date of birth: 1941/06/19  CC & HPI:  Leslie Berry is a 73 y.o. female presenting today for complications with her pessary.  She states that it will not stay in place.  She started using a pessary 6 years ago.  ROS:  All systems have been reviewed and are negative unless otherwise specified in the HPI.   Pertinent History Reviewed:   Reviewed: Significant for  Medical         Past Medical History  Diagnosis Date  . Gout   . Elevated LFTs   . Hypertension     dr bland  pcp  . Diabetes mellitus   . GERD (gastroesophageal reflux disease)   . Arthritis                               Surgical Hx:    Past Surgical History  Procedure Laterality Date  . Rotator cuff repair    . Cesarean section    . Carpal tunnel release    . Carpal tunnel release  12/22/2011    Procedure: CARPAL TUNNEL RELEASE;  Surgeon: Kennieth RadArthur F Carter, MD;  [facility];  Service: Orthopedics;  Laterality: Right;   Medications: Reviewed & Updated - see associated section                      Current outpatient prescriptions: acetaminophen (TYLENOL) 500 MG tablet, Take 1,000 mg by mouth every 12 (twelve) hours as needed. Arthritis, Disp: , Rfl: ;  amLODipine-valsartan (EXFORGE) 10-320 MG per tablet, Take 1 tablet by mouth daily., Disp: , Rfl: ;  COLCRYS 0.6 MG tablet, , Disp: , Rfl: ;  hydrochlorothiazide (HYDRODIURIL) 12.5 MG tablet, , Disp: , Rfl: ;  metFORMIN (GLUCOPHAGE) 500 MG tablet, Take 500 mg by mouth daily., Disp: , Rfl:  omeprazole (PRILOSEC) 20 MG capsule, Take 20 mg by mouth daily., Disp: , Rfl: ;  ursodiol (ACTIGALL) 300 MG capsule, Take 600 mg by mouth 2 (two) times daily., Disp: , Rfl: ;  Vitamin D, Ergocalciferol, (DRISDOL) 50000 UNITS CAPS, Take 50,000 Units by mouth every 7 (seven) days. Patient takes on Thursday, Disp: , Rfl:    Social  History: Reviewed -  reports that she quit smoking about 31 years ago. She has never used smokeless tobacco.  Objective Findings:  Vitals: Blood pressure 140/90, height 5\' 5"  (1.651 m), weight 160 lb (72.576 kg).  Physical Examination: General appearance - alert, well appearing, and in no distress, oriented to person, place, and time and normal appearing weight Pelvic - VULVA: normal appearing vulva with no masses, tenderness or lesions,  VAGINA: normal appearing vagina with normal color and discharge, no lesions, large cystocele and  CERVIX: normal appearing cervix without discharge or lesions,  UTERUS: uterus is normal size, shape, consistency and nontender, first degree uterine descensus  ADNEXA: normal adnexa in size, nontender and no masses   Assessment & Plan:   A:  1. Cystocele 2. Pessary fitting  P:  1. GelHorn 70mm  This chart was scribed for Tilda BurrowJohn Hisae Decoursey V, MD by Carl Bestelina Holson, ED Scribe. This patient was seen in Room 3 and the patient's care was started at 10:18 AM.

## 2014-09-08 DIAGNOSIS — M1000 Idiopathic gout, unspecified site: Secondary | ICD-10-CM | POA: Diagnosis not present

## 2014-09-08 DIAGNOSIS — M1991 Primary osteoarthritis, unspecified site: Secondary | ICD-10-CM | POA: Diagnosis not present

## 2014-09-08 DIAGNOSIS — I10 Essential (primary) hypertension: Secondary | ICD-10-CM | POA: Diagnosis not present

## 2014-09-08 DIAGNOSIS — E1100 Type 2 diabetes mellitus with hyperosmolarity without nonketotic hyperglycemic-hyperosmolar coma (NKHHC): Secondary | ICD-10-CM | POA: Diagnosis not present

## 2014-09-15 ENCOUNTER — Other Ambulatory Visit (HOSPITAL_COMMUNITY): Payer: Self-pay | Admitting: Family Medicine

## 2014-09-15 DIAGNOSIS — Z1231 Encounter for screening mammogram for malignant neoplasm of breast: Secondary | ICD-10-CM

## 2014-09-18 ENCOUNTER — Ambulatory Visit (HOSPITAL_COMMUNITY)
Admission: RE | Admit: 2014-09-18 | Discharge: 2014-09-18 | Disposition: A | Discharge status: Home / Self Care | Payer: Medicare Other | Source: Ambulatory Visit | Attending: Family Medicine | Admitting: Family Medicine

## 2014-09-18 DIAGNOSIS — Z1231 Encounter for screening mammogram for malignant neoplasm of breast: Secondary | ICD-10-CM | POA: Diagnosis not present

## 2014-09-21 ENCOUNTER — Other Ambulatory Visit: Payer: Self-pay | Admitting: Family Medicine

## 2014-09-21 DIAGNOSIS — R928 Other abnormal and inconclusive findings on diagnostic imaging of breast: Secondary | ICD-10-CM

## 2014-09-29 ENCOUNTER — Ambulatory Visit (HOSPITAL_COMMUNITY)
Admission: RE | Admit: 2014-09-29 | Discharge: 2014-09-29 | Disposition: A | Discharge status: Home / Self Care | Payer: Medicare Other | Source: Ambulatory Visit | Attending: Family Medicine | Admitting: Family Medicine

## 2014-09-29 ENCOUNTER — Other Ambulatory Visit (HOSPITAL_COMMUNITY): Payer: Self-pay | Admitting: Family Medicine

## 2014-09-29 DIAGNOSIS — N641 Fat necrosis of breast: Secondary | ICD-10-CM | POA: Insufficient documentation

## 2014-09-29 DIAGNOSIS — N6001 Solitary cyst of right breast: Secondary | ICD-10-CM | POA: Diagnosis not present

## 2014-09-29 DIAGNOSIS — N6310 Unspecified lump in the right breast, unspecified quadrant: Secondary | ICD-10-CM

## 2014-09-29 DIAGNOSIS — R928 Other abnormal and inconclusive findings on diagnostic imaging of breast: Secondary | ICD-10-CM | POA: Insufficient documentation

## 2014-12-22 DIAGNOSIS — K743 Primary biliary cirrhosis: Secondary | ICD-10-CM | POA: Diagnosis not present

## 2014-12-22 DIAGNOSIS — K219 Gastro-esophageal reflux disease without esophagitis: Secondary | ICD-10-CM | POA: Diagnosis not present

## 2014-12-24 DIAGNOSIS — E119 Type 2 diabetes mellitus without complications: Secondary | ICD-10-CM | POA: Diagnosis not present

## 2014-12-24 DIAGNOSIS — M545 Low back pain: Secondary | ICD-10-CM | POA: Diagnosis not present

## 2014-12-31 DIAGNOSIS — M545 Low back pain: Secondary | ICD-10-CM | POA: Diagnosis not present

## 2014-12-31 DIAGNOSIS — R2681 Unsteadiness on feet: Secondary | ICD-10-CM | POA: Diagnosis not present

## 2014-12-31 DIAGNOSIS — R293 Abnormal posture: Secondary | ICD-10-CM | POA: Diagnosis not present

## 2014-12-31 DIAGNOSIS — M6281 Muscle weakness (generalized): Secondary | ICD-10-CM | POA: Diagnosis not present

## 2015-01-05 DIAGNOSIS — R2681 Unsteadiness on feet: Secondary | ICD-10-CM | POA: Diagnosis not present

## 2015-01-05 DIAGNOSIS — M6281 Muscle weakness (generalized): Secondary | ICD-10-CM | POA: Diagnosis not present

## 2015-01-05 DIAGNOSIS — R293 Abnormal posture: Secondary | ICD-10-CM | POA: Diagnosis not present

## 2015-01-05 DIAGNOSIS — M545 Low back pain: Secondary | ICD-10-CM | POA: Diagnosis not present

## 2015-01-06 DIAGNOSIS — M545 Low back pain: Secondary | ICD-10-CM | POA: Diagnosis not present

## 2015-01-06 DIAGNOSIS — M6281 Muscle weakness (generalized): Secondary | ICD-10-CM | POA: Diagnosis not present

## 2015-01-06 DIAGNOSIS — R2681 Unsteadiness on feet: Secondary | ICD-10-CM | POA: Diagnosis not present

## 2015-01-06 DIAGNOSIS — R293 Abnormal posture: Secondary | ICD-10-CM | POA: Diagnosis not present

## 2015-01-08 DIAGNOSIS — M545 Low back pain: Secondary | ICD-10-CM | POA: Diagnosis not present

## 2015-01-08 DIAGNOSIS — M6281 Muscle weakness (generalized): Secondary | ICD-10-CM | POA: Diagnosis not present

## 2015-01-08 DIAGNOSIS — R293 Abnormal posture: Secondary | ICD-10-CM | POA: Diagnosis not present

## 2015-01-08 DIAGNOSIS — R2681 Unsteadiness on feet: Secondary | ICD-10-CM | POA: Diagnosis not present

## 2015-01-11 DIAGNOSIS — M6281 Muscle weakness (generalized): Secondary | ICD-10-CM | POA: Diagnosis not present

## 2015-01-11 DIAGNOSIS — R2681 Unsteadiness on feet: Secondary | ICD-10-CM | POA: Diagnosis not present

## 2015-01-11 DIAGNOSIS — M545 Low back pain: Secondary | ICD-10-CM | POA: Diagnosis not present

## 2015-01-11 DIAGNOSIS — R293 Abnormal posture: Secondary | ICD-10-CM | POA: Diagnosis not present

## 2015-01-13 DIAGNOSIS — M545 Low back pain: Secondary | ICD-10-CM | POA: Diagnosis not present

## 2015-01-13 DIAGNOSIS — R293 Abnormal posture: Secondary | ICD-10-CM | POA: Diagnosis not present

## 2015-01-13 DIAGNOSIS — M6281 Muscle weakness (generalized): Secondary | ICD-10-CM | POA: Diagnosis not present

## 2015-01-13 DIAGNOSIS — R2681 Unsteadiness on feet: Secondary | ICD-10-CM | POA: Diagnosis not present

## 2015-01-15 DIAGNOSIS — R293 Abnormal posture: Secondary | ICD-10-CM | POA: Diagnosis not present

## 2015-01-15 DIAGNOSIS — M6281 Muscle weakness (generalized): Secondary | ICD-10-CM | POA: Diagnosis not present

## 2015-01-15 DIAGNOSIS — R2681 Unsteadiness on feet: Secondary | ICD-10-CM | POA: Diagnosis not present

## 2015-01-15 DIAGNOSIS — M545 Low back pain: Secondary | ICD-10-CM | POA: Diagnosis not present

## 2015-01-19 DIAGNOSIS — M545 Low back pain: Secondary | ICD-10-CM | POA: Diagnosis not present

## 2015-01-19 DIAGNOSIS — M6281 Muscle weakness (generalized): Secondary | ICD-10-CM | POA: Diagnosis not present

## 2015-01-19 DIAGNOSIS — R2681 Unsteadiness on feet: Secondary | ICD-10-CM | POA: Diagnosis not present

## 2015-01-19 DIAGNOSIS — R293 Abnormal posture: Secondary | ICD-10-CM | POA: Diagnosis not present

## 2015-01-21 DIAGNOSIS — M6281 Muscle weakness (generalized): Secondary | ICD-10-CM | POA: Diagnosis not present

## 2015-01-21 DIAGNOSIS — R2681 Unsteadiness on feet: Secondary | ICD-10-CM | POA: Diagnosis not present

## 2015-01-21 DIAGNOSIS — M545 Low back pain: Secondary | ICD-10-CM | POA: Diagnosis not present

## 2015-01-21 DIAGNOSIS — R293 Abnormal posture: Secondary | ICD-10-CM | POA: Diagnosis not present

## 2015-01-22 DIAGNOSIS — E1100 Type 2 diabetes mellitus with hyperosmolarity without nonketotic hyperglycemic-hyperosmolar coma (NKHHC): Secondary | ICD-10-CM | POA: Diagnosis not present

## 2015-01-22 DIAGNOSIS — M545 Low back pain: Secondary | ICD-10-CM | POA: Diagnosis not present

## 2015-01-22 DIAGNOSIS — I10 Essential (primary) hypertension: Secondary | ICD-10-CM | POA: Diagnosis not present

## 2015-01-22 DIAGNOSIS — M1991 Primary osteoarthritis, unspecified site: Secondary | ICD-10-CM | POA: Diagnosis not present

## 2015-01-26 DIAGNOSIS — M545 Low back pain: Secondary | ICD-10-CM | POA: Diagnosis not present

## 2015-01-26 DIAGNOSIS — R2681 Unsteadiness on feet: Secondary | ICD-10-CM | POA: Diagnosis not present

## 2015-01-26 DIAGNOSIS — M6281 Muscle weakness (generalized): Secondary | ICD-10-CM | POA: Diagnosis not present

## 2015-01-26 DIAGNOSIS — R293 Abnormal posture: Secondary | ICD-10-CM | POA: Diagnosis not present

## 2015-01-28 DIAGNOSIS — M545 Low back pain: Secondary | ICD-10-CM | POA: Diagnosis not present

## 2015-01-28 DIAGNOSIS — M6281 Muscle weakness (generalized): Secondary | ICD-10-CM | POA: Diagnosis not present

## 2015-01-28 DIAGNOSIS — R293 Abnormal posture: Secondary | ICD-10-CM | POA: Diagnosis not present

## 2015-01-28 DIAGNOSIS — R2681 Unsteadiness on feet: Secondary | ICD-10-CM | POA: Diagnosis not present

## 2015-02-02 DIAGNOSIS — M545 Low back pain: Secondary | ICD-10-CM | POA: Diagnosis not present

## 2015-02-02 DIAGNOSIS — M6281 Muscle weakness (generalized): Secondary | ICD-10-CM | POA: Diagnosis not present

## 2015-02-02 DIAGNOSIS — R2681 Unsteadiness on feet: Secondary | ICD-10-CM | POA: Diagnosis not present

## 2015-02-02 DIAGNOSIS — R293 Abnormal posture: Secondary | ICD-10-CM | POA: Diagnosis not present

## 2015-02-04 DIAGNOSIS — M545 Low back pain: Secondary | ICD-10-CM | POA: Diagnosis not present

## 2015-02-04 DIAGNOSIS — R2681 Unsteadiness on feet: Secondary | ICD-10-CM | POA: Diagnosis not present

## 2015-02-04 DIAGNOSIS — R293 Abnormal posture: Secondary | ICD-10-CM | POA: Diagnosis not present

## 2015-02-04 DIAGNOSIS — M6281 Muscle weakness (generalized): Secondary | ICD-10-CM | POA: Diagnosis not present

## 2015-02-09 DIAGNOSIS — M6281 Muscle weakness (generalized): Secondary | ICD-10-CM | POA: Diagnosis not present

## 2015-02-09 DIAGNOSIS — R2681 Unsteadiness on feet: Secondary | ICD-10-CM | POA: Diagnosis not present

## 2015-02-09 DIAGNOSIS — R293 Abnormal posture: Secondary | ICD-10-CM | POA: Diagnosis not present

## 2015-02-09 DIAGNOSIS — M545 Low back pain: Secondary | ICD-10-CM | POA: Diagnosis not present

## 2015-02-11 DIAGNOSIS — M545 Low back pain: Secondary | ICD-10-CM | POA: Diagnosis not present

## 2015-02-11 DIAGNOSIS — R293 Abnormal posture: Secondary | ICD-10-CM | POA: Diagnosis not present

## 2015-02-11 DIAGNOSIS — M6281 Muscle weakness (generalized): Secondary | ICD-10-CM | POA: Diagnosis not present

## 2015-02-11 DIAGNOSIS — R2681 Unsteadiness on feet: Secondary | ICD-10-CM | POA: Diagnosis not present

## 2015-02-16 DIAGNOSIS — M545 Low back pain: Secondary | ICD-10-CM | POA: Diagnosis not present

## 2015-02-16 DIAGNOSIS — R2681 Unsteadiness on feet: Secondary | ICD-10-CM | POA: Diagnosis not present

## 2015-02-16 DIAGNOSIS — M6281 Muscle weakness (generalized): Secondary | ICD-10-CM | POA: Diagnosis not present

## 2015-02-16 DIAGNOSIS — R293 Abnormal posture: Secondary | ICD-10-CM | POA: Diagnosis not present

## 2015-02-18 DIAGNOSIS — M545 Low back pain: Secondary | ICD-10-CM | POA: Diagnosis not present

## 2015-02-18 DIAGNOSIS — R2681 Unsteadiness on feet: Secondary | ICD-10-CM | POA: Diagnosis not present

## 2015-02-18 DIAGNOSIS — R293 Abnormal posture: Secondary | ICD-10-CM | POA: Diagnosis not present

## 2015-02-18 DIAGNOSIS — M6281 Muscle weakness (generalized): Secondary | ICD-10-CM | POA: Diagnosis not present

## 2015-03-08 DIAGNOSIS — I10 Essential (primary) hypertension: Secondary | ICD-10-CM | POA: Diagnosis not present

## 2015-03-08 DIAGNOSIS — M1991 Primary osteoarthritis, unspecified site: Secondary | ICD-10-CM | POA: Diagnosis not present

## 2015-03-08 DIAGNOSIS — M1000 Idiopathic gout, unspecified site: Secondary | ICD-10-CM | POA: Diagnosis not present

## 2015-03-08 DIAGNOSIS — E119 Type 2 diabetes mellitus without complications: Secondary | ICD-10-CM | POA: Diagnosis not present

## 2015-05-21 DIAGNOSIS — K649 Unspecified hemorrhoids: Secondary | ICD-10-CM | POA: Diagnosis not present

## 2015-05-21 DIAGNOSIS — Z23 Encounter for immunization: Secondary | ICD-10-CM | POA: Diagnosis not present

## 2015-05-21 DIAGNOSIS — E118 Type 2 diabetes mellitus with unspecified complications: Secondary | ICD-10-CM | POA: Diagnosis not present

## 2015-06-08 DIAGNOSIS — K649 Unspecified hemorrhoids: Secondary | ICD-10-CM | POA: Diagnosis not present

## 2015-07-06 DIAGNOSIS — F064 Anxiety disorder due to known physiological condition: Secondary | ICD-10-CM | POA: Diagnosis not present

## 2015-07-06 DIAGNOSIS — F5102 Adjustment insomnia: Secondary | ICD-10-CM | POA: Diagnosis not present

## 2015-07-06 DIAGNOSIS — I10 Essential (primary) hypertension: Secondary | ICD-10-CM | POA: Diagnosis not present

## 2015-07-06 DIAGNOSIS — E1100 Type 2 diabetes mellitus with hyperosmolarity without nonketotic hyperglycemic-hyperosmolar coma (NKHHC): Secondary | ICD-10-CM | POA: Diagnosis not present

## 2015-08-25 ENCOUNTER — Other Ambulatory Visit (HOSPITAL_COMMUNITY): Payer: Self-pay | Admitting: Family Medicine

## 2015-08-25 DIAGNOSIS — Z1231 Encounter for screening mammogram for malignant neoplasm of breast: Secondary | ICD-10-CM

## 2015-09-07 DIAGNOSIS — E119 Type 2 diabetes mellitus without complications: Secondary | ICD-10-CM | POA: Diagnosis not present

## 2015-09-07 DIAGNOSIS — K649 Unspecified hemorrhoids: Secondary | ICD-10-CM | POA: Diagnosis not present

## 2015-09-07 DIAGNOSIS — M1991 Primary osteoarthritis, unspecified site: Secondary | ICD-10-CM | POA: Diagnosis not present

## 2015-09-20 ENCOUNTER — Ambulatory Visit (HOSPITAL_COMMUNITY)
Admission: RE | Admit: 2015-09-20 | Discharge: 2015-09-20 | Disposition: A | Discharge status: Home / Self Care | Payer: Medicare Other | Source: Ambulatory Visit | Attending: Family Medicine | Admitting: Family Medicine

## 2015-09-20 DIAGNOSIS — Z1231 Encounter for screening mammogram for malignant neoplasm of breast: Secondary | ICD-10-CM

## 2015-12-24 ENCOUNTER — Emergency Department (HOSPITAL_COMMUNITY)
Admission: EM | Admit: 2015-12-24 | Discharge: 2015-12-24 | Disposition: A | Discharge status: Home / Self Care | Payer: Medicare Other | Attending: Emergency Medicine | Admitting: Emergency Medicine

## 2015-12-24 ENCOUNTER — Encounter (HOSPITAL_COMMUNITY): Payer: Self-pay | Admitting: *Deleted

## 2015-12-24 DIAGNOSIS — Z79899 Other long term (current) drug therapy: Secondary | ICD-10-CM | POA: Diagnosis not present

## 2015-12-24 DIAGNOSIS — M79671 Pain in right foot: Secondary | ICD-10-CM | POA: Diagnosis present

## 2015-12-24 DIAGNOSIS — M25571 Pain in right ankle and joints of right foot: Secondary | ICD-10-CM | POA: Diagnosis not present

## 2015-12-24 DIAGNOSIS — Z87891 Personal history of nicotine dependence: Secondary | ICD-10-CM | POA: Insufficient documentation

## 2015-12-24 DIAGNOSIS — M1000 Idiopathic gout, unspecified site: Secondary | ICD-10-CM | POA: Diagnosis not present

## 2015-12-24 DIAGNOSIS — I10 Essential (primary) hypertension: Secondary | ICD-10-CM | POA: Diagnosis not present

## 2015-12-24 DIAGNOSIS — Z7984 Long term (current) use of oral hypoglycemic drugs: Secondary | ICD-10-CM | POA: Insufficient documentation

## 2015-12-24 DIAGNOSIS — E119 Type 2 diabetes mellitus without complications: Secondary | ICD-10-CM | POA: Insufficient documentation

## 2015-12-24 DIAGNOSIS — M1990 Unspecified osteoarthritis, unspecified site: Secondary | ICD-10-CM | POA: Insufficient documentation

## 2015-12-24 MED ORDER — HYDROCODONE-ACETAMINOPHEN 5-325 MG PO TABS: ORAL_TABLET | ORAL | Status: DC

## 2015-12-24 NOTE — ED Notes (Signed)
Pt states she has had right foot swelling and tenderness since last Friday. Pt denies any injury to this ankle. History of gout. NAD noted. Pt is ambulatory.

## 2015-12-24 NOTE — ED Notes (Signed)
Patient with no complaints at this time. Respirations even and unlabored. Skin warm/dry. Discharge instructions reviewed with patient at this time. Patient given opportunity to voice concerns/ask questions. Patient discharged at this time and left Emergency Department with steady gait.   

## 2015-12-24 NOTE — Discharge Instructions (Signed)
Please continue your culture seen as ordered. Use Tylenol or ibuprofen for mild pain. May use Norco at bedtime, or every 6 hours if needed for pain. Please see Dr. Parke SimmersBland, or return to the emergency department if not improving. Gout Gout is when your joints become red, sore, and swell (inflamed). This is caused by the buildup of uric acid crystals in the joints. Uric acid is a chemical that is normally in the blood. If the level of uric acid gets too high in the blood, these crystals form in your joints and tissues. Over time, these crystals can form into masses near the joints and tissues. These masses can destroy bone and cause the bone to look misshapen (deformed). HOME CARE   Do not take aspirin for pain.  Only take medicine as told by your doctor.  Rest the joint as much as you can. When in bed, keep sheets and blankets off painful areas.  Keep the sore joints raised (elevated).  Put warm or cold packs on painful joints. Use of warm or cold packs depends on which works best for you.  Use crutches if the painful joint is in your leg.  Drink enough fluids to keep your pee (urine) clear or pale yellow. Limit alcohol, sugary drinks, and drinks with fructose in them.  Follow your diet instructions. Pay careful attention to how much protein you eat. Include fruits, vegetables, whole grains, and fat-free or low-fat milk products in your daily diet. Talk to your doctor or dietitian about the use of coffee, vitamin C, and cherries. These may help lower uric acid levels.  Keep a healthy body weight. GET HELP RIGHT AWAY IF:   You have watery poop (diarrhea), throw up (vomit), or have any side effects from medicines.  You do not feel better in 24 hours, or you are getting worse.  Your joint becomes suddenly more tender, and you have chills or a fever. MAKE SURE YOU:   Understand these instructions.  Will watch your condition.  Will get help right away if you are not doing well or get  worse.   This information is not intended to replace advice given to you by your health care provider. Make sure you discuss any questions you have with your health care provider.   Document Released: 04/18/2008 Document Revised: 07/31/2014 Document Reviewed: 02/21/2012 Elsevier Interactive Patient Education 2016 [employer] I'm afraid of

## 2015-12-24 NOTE — ED Provider Notes (Signed)
CSN: [id number]     Arrival date & time 12/24/15  0849 History   First MD Initiated Contact with Patient 12/24/15 0900     Chief Complaint  Patient presents with  . Foot Pain     (Consider location/radiation/quality/duration/timing/severity/associated sxs/prior Treatment) HPI Comments: Patient is a 75 year old female who presents to the emergency department with a complaint of right foot pain.  Leslie Berry has a history of gout, diabetes mellitus, hypertension, and arthritis. She states that she has been having pain in her right foot for nearly a week. She has been trying to take care of it with over-the-counter medications as well as her culture seen on. She states however that this is not helping, and she was unable to rest well because of the discomfort on last night. She denies any injury to the foot or ankle. She denies any puncture wounds on. She's not had any recent operations or procedures on to the right lower extremity. The pain is worse when she is standing and with certain movement.  The history is provided by the patient.    Past Medical History  Diagnosis Date  . Gout   . Elevated LFTs   . Hypertension     dr bland  pcp  . Diabetes mellitus   . GERD (gastroesophageal reflux disease)   . Arthritis    Past Surgical History  Procedure Laterality Date  . Rotator cuff repair    . Cesarean section    . Carpal tunnel release    . Carpal tunnel release  12/22/2011    Procedure: CARPAL TUNNEL RELEASE;  Surgeon: Kennieth Rad, MD;  Location: [facility] OR;  Service: Orthopedics;  Laterality: Right;   Family History  Problem Relation Age of Onset  . Anesthesia problems Neg Hx   . Hypertension Mother   . Hypertension Maternal Grandmother   . Hypertension Maternal Grandfather   . Cancer Sister    Social History  Substance Use Topics  . Smoking status: Former Smoker    Quit date: 05/15/1983  . Smokeless tobacco: Never Used  . Alcohol Use: No   OB History    No data available      Review of Systems  Musculoskeletal: Positive for arthralgias.  All other systems reviewed and are negative.     Allergies  Review of patient's allergies indicates no known allergies.  Home Medications   Prior to Admission medications   Medication Sig Start Date End Date Taking? Authorizing Provider  acetaminophen (TYLENOL) 500 MG tablet Take 1,000 mg by mouth every 12 (twelve) hours as needed. Arthritis    Historical Provider, MD  amLODipine-valsartan (EXFORGE) 10-320 MG per tablet Take 1 tablet by mouth daily.    Historical Provider, MD  COLCRYS 0.6 MG tablet  03/17/13   Historical Provider, MD  hydrochlorothiazide (HYDRODIURIL) 12.5 MG tablet  05/09/13   Historical Provider, MD  metFORMIN (GLUCOPHAGE) 500 MG tablet Take 500 mg by mouth daily.    Historical Provider, MD  omeprazole (PRILOSEC) 20 MG capsule Take 20 mg by mouth daily.    Historical Provider, MD  ursodiol (ACTIGALL) 300 MG capsule Take 600 mg by mouth 2 (two) times daily.    Historical Provider, MD  Vitamin D, Ergocalciferol, (DRISDOL) 50000 UNITS CAPS Take 50,000 Units by mouth every 7 (seven) days. Patient takes on Thursday    Historical Provider, MD   BP 143/95 mmHg  Pulse 76  Temp(Src) 98.3 F (36.8 C) (Oral)  Resp 15  Ht  (1.651 m)  Wt 69.854 kg  BMI 25.63 kg/m2  SpO2 95% Physical Exam  Constitutional: She is oriented to person, place, and time. She appears well-developed and well-nourished.  Non-toxic appearance.  HENT:  Head: Normocephalic.  Right Ear: Tympanic membrane and external ear normal.  Left Ear: Tympanic membrane and external ear normal.  Eyes: EOM and lids are normal. Pupils are equal, round, and reactive to light.  Neck: Normal range of motion. Neck supple. Carotid bruit is not present.  Cardiovascular: Normal rate, regular rhythm, normal heart sounds, intact distal pulses and normal pulses.   Pulmonary/Chest: Breath sounds normal. No respiratory distress.  Abdominal: Soft. Bowel  sounds are normal. There is no tenderness. There is no guarding.  Musculoskeletal: Normal range of motion.  There is mild-to-moderate swelling of the right MP joint. The area is not hot. There is pain to palpation. There is no swelling or pain of the other toes of the right foot. The dorsalis pedis pulses 2+. The Achilles tendon is intact. There is good range of motion of the right knee and hip. No puncture wounds noted of the plantar surface of the right foot. No lesions between the toes.  Lymphadenopathy:       Head (right side): No submandibular adenopathy present.       Head (left side): No submandibular adenopathy present.    She has no cervical adenopathy.  Neurological: She is alert and oriented to person, place, and time. She has normal strength. No cranial nerve deficit or sensory deficit.  Skin: Skin is warm and dry.  Psychiatric: She has a normal mood and affect. Her speech is normal.  Nursing note and vitals reviewed.   ED Course Pt seen with me by Dr Estell HarpinZammit.  Procedures (including critical care time) Labs Review Labs Reviewed - No data to display  Imaging Review No results found. I have personally reviewed and evaluated these images and lab results as part of my medical decision-making.   EKG Interpretation None      MDM  Vital signs within normal limits. Pulse oximetry is 95% on room air. There no lesions between the toes, or puncture wounds, or signs of infection. There's been no history of trauma. Suspect the patient is having an exacerbation of her arthritis, and/or gout. The patient is advised to continue her colchicine on. She will use Tylenol for mild pain, and is given a prescription for Norco for more severe pain. We discuss the importance of using the Norco safely because of drowsiness, and or lightheadedness. The patient is in agreement with this discharge plan. She will follow-up with Leslie Berry, or return to the emergency department if not improving.    Final  diagnoses:  Idiopathic gout, unspecified chronicity, unspecified site    *I have reviewed nursing notes, vital signs, and all appropriate lab and imaging results for this patient.    Leslie QualeHobson Auren Valdes, PA-C 12/24/15 1004  Bethann BerkshireJoseph Zammit, MD 12/24/15 [phone]

## 2015-12-28 ENCOUNTER — Ambulatory Visit (HOSPITAL_COMMUNITY)
Admission: RE | Admit: 2015-12-28 | Discharge: 2015-12-28 | Disposition: A | Discharge status: Home / Self Care | Payer: Medicare Other | Source: Ambulatory Visit | Attending: Family Medicine | Admitting: Family Medicine

## 2015-12-28 ENCOUNTER — Other Ambulatory Visit (HOSPITAL_COMMUNITY): Payer: Self-pay | Admitting: Family Medicine

## 2015-12-28 DIAGNOSIS — M1610 Unilateral primary osteoarthritis, unspecified hip: Secondary | ICD-10-CM

## 2015-12-28 DIAGNOSIS — M25552 Pain in left hip: Secondary | ICD-10-CM | POA: Diagnosis not present

## 2015-12-28 DIAGNOSIS — M25551 Pain in right hip: Secondary | ICD-10-CM | POA: Diagnosis not present

## 2015-12-28 DIAGNOSIS — R52 Pain, unspecified: Secondary | ICD-10-CM

## 2015-12-28 DIAGNOSIS — I10 Essential (primary) hypertension: Secondary | ICD-10-CM | POA: Diagnosis not present

## 2015-12-28 DIAGNOSIS — M1991 Primary osteoarthritis, unspecified site: Secondary | ICD-10-CM | POA: Diagnosis not present

## 2015-12-28 DIAGNOSIS — E119 Type 2 diabetes mellitus without complications: Secondary | ICD-10-CM | POA: Diagnosis not present

## 2015-12-28 DIAGNOSIS — M160 Bilateral primary osteoarthritis of hip: Secondary | ICD-10-CM | POA: Diagnosis not present

## 2015-12-28 DIAGNOSIS — M1000 Idiopathic gout, unspecified site: Secondary | ICD-10-CM | POA: Diagnosis not present

## 2016-01-28 DIAGNOSIS — I10 Essential (primary) hypertension: Secondary | ICD-10-CM | POA: Diagnosis not present

## 2016-01-28 DIAGNOSIS — E119 Type 2 diabetes mellitus without complications: Secondary | ICD-10-CM | POA: Diagnosis not present

## 2016-01-28 DIAGNOSIS — M1991 Primary osteoarthritis, unspecified site: Secondary | ICD-10-CM | POA: Diagnosis not present

## 2016-01-28 DIAGNOSIS — M545 Low back pain: Secondary | ICD-10-CM | POA: Diagnosis not present

## 2016-02-03 ENCOUNTER — Other Ambulatory Visit: Payer: Self-pay | Admitting: Gastroenterology

## 2016-02-03 DIAGNOSIS — K743 Primary biliary cirrhosis: Secondary | ICD-10-CM | POA: Diagnosis not present

## 2016-02-03 DIAGNOSIS — Z800 Family history of malignant neoplasm of digestive organs: Secondary | ICD-10-CM | POA: Diagnosis not present

## 2016-02-03 DIAGNOSIS — K219 Gastro-esophageal reflux disease without esophagitis: Secondary | ICD-10-CM | POA: Diagnosis not present

## 2016-02-22 ENCOUNTER — Other Ambulatory Visit: Payer: Medicare Other

## 2016-05-04 DIAGNOSIS — Z79899 Other long term (current) drug therapy: Secondary | ICD-10-CM | POA: Diagnosis not present

## 2016-05-04 DIAGNOSIS — L930 Discoid lupus erythematosus: Secondary | ICD-10-CM | POA: Diagnosis not present

## 2016-05-26 ENCOUNTER — Other Ambulatory Visit (HOSPITAL_COMMUNITY): Payer: Self-pay | Admitting: Family Medicine

## 2016-05-26 DIAGNOSIS — I10 Essential (primary) hypertension: Secondary | ICD-10-CM | POA: Diagnosis not present

## 2016-05-26 DIAGNOSIS — E119 Type 2 diabetes mellitus without complications: Secondary | ICD-10-CM | POA: Diagnosis not present

## 2016-05-26 DIAGNOSIS — Z780 Asymptomatic menopausal state: Secondary | ICD-10-CM

## 2016-05-26 DIAGNOSIS — M545 Low back pain: Secondary | ICD-10-CM | POA: Diagnosis not present

## 2016-05-26 DIAGNOSIS — E2839 Other primary ovarian failure: Secondary | ICD-10-CM

## 2016-05-26 DIAGNOSIS — M2500 Hemarthrosis, unspecified joint: Secondary | ICD-10-CM | POA: Diagnosis not present

## 2016-05-26 DIAGNOSIS — M1991 Primary osteoarthritis, unspecified site: Secondary | ICD-10-CM | POA: Diagnosis not present

## 2016-06-01 DIAGNOSIS — L930 Discoid lupus erythematosus: Secondary | ICD-10-CM | POA: Diagnosis not present

## 2016-06-02 ENCOUNTER — Ambulatory Visit (HOSPITAL_COMMUNITY)
Admission: RE | Admit: 2016-06-02 | Discharge: 2016-06-02 | Disposition: A | Discharge status: Home / Self Care | Payer: Medicare Other | Source: Ambulatory Visit | Attending: Family Medicine | Admitting: Family Medicine

## 2016-06-02 DIAGNOSIS — M810 Age-related osteoporosis without current pathological fracture: Secondary | ICD-10-CM | POA: Insufficient documentation

## 2016-06-02 DIAGNOSIS — Z780 Asymptomatic menopausal state: Secondary | ICD-10-CM | POA: Insufficient documentation

## 2016-06-02 DIAGNOSIS — E2839 Other primary ovarian failure: Secondary | ICD-10-CM

## 2016-06-20 DIAGNOSIS — M1A49X0 Other secondary chronic gout, multiple sites, without tophus (tophi): Secondary | ICD-10-CM | POA: Diagnosis not present

## 2016-06-20 DIAGNOSIS — R768 Other specified abnormal immunological findings in serum: Secondary | ICD-10-CM | POA: Diagnosis not present

## 2016-06-20 DIAGNOSIS — R21 Rash and other nonspecific skin eruption: Secondary | ICD-10-CM | POA: Diagnosis not present

## 2016-07-14 DIAGNOSIS — H52223 Regular astigmatism, bilateral: Secondary | ICD-10-CM | POA: Diagnosis not present

## 2016-07-14 DIAGNOSIS — H524 Presbyopia: Secondary | ICD-10-CM | POA: Diagnosis not present

## 2016-07-14 DIAGNOSIS — H5203 Hypermetropia, bilateral: Secondary | ICD-10-CM | POA: Diagnosis not present

## 2016-07-14 DIAGNOSIS — H3540 Unspecified peripheral retinal degeneration: Secondary | ICD-10-CM | POA: Diagnosis not present

## 2016-08-02 ENCOUNTER — Other Ambulatory Visit (HOSPITAL_COMMUNITY)
Admission: RE | Admit: 2016-08-02 | Discharge: 2016-08-02 | Disposition: A | Discharge status: Home / Self Care | Payer: Medicare Other | Source: Ambulatory Visit | Attending: Family Medicine | Admitting: Family Medicine

## 2016-08-02 ENCOUNTER — Other Ambulatory Visit: Payer: Self-pay | Admitting: Family Medicine

## 2016-08-02 DIAGNOSIS — Z113 Encounter for screening for infections with a predominantly sexual mode of transmission: Secondary | ICD-10-CM | POA: Diagnosis present

## 2016-08-02 DIAGNOSIS — I10 Essential (primary) hypertension: Secondary | ICD-10-CM | POA: Diagnosis not present

## 2016-08-02 DIAGNOSIS — E119 Type 2 diabetes mellitus without complications: Secondary | ICD-10-CM | POA: Diagnosis not present

## 2016-08-07 LAB — URINE CYTOLOGY ANCILLARY ONLY
Bacterial vaginitis: POSITIVE — AB
CHLAMYDIA, DNA PROBE: NEGATIVE
Candida vaginitis: NEGATIVE
NEISSERIA GONORRHEA: NEGATIVE
TRICH (WINDOWPATH): NEGATIVE

## 2016-08-29 ENCOUNTER — Other Ambulatory Visit (HOSPITAL_COMMUNITY): Payer: Self-pay | Admitting: Family Medicine

## 2016-08-29 DIAGNOSIS — Z1231 Encounter for screening mammogram for malignant neoplasm of breast: Secondary | ICD-10-CM

## 2016-09-13 ENCOUNTER — Other Ambulatory Visit: Payer: Self-pay | Admitting: Family Medicine

## 2016-09-13 ENCOUNTER — Other Ambulatory Visit (HOSPITAL_COMMUNITY)
Admission: RE | Admit: 2016-09-13 | Discharge: 2016-09-13 | Disposition: A | Discharge status: Home / Self Care | Payer: Medicare Other | Source: Ambulatory Visit | Attending: Family Medicine | Admitting: Family Medicine

## 2016-09-13 DIAGNOSIS — E118 Type 2 diabetes mellitus with unspecified complications: Secondary | ICD-10-CM | POA: Diagnosis not present

## 2016-09-13 DIAGNOSIS — I10 Essential (primary) hypertension: Secondary | ICD-10-CM | POA: Diagnosis not present

## 2016-09-13 DIAGNOSIS — N761 Subacute and chronic vaginitis: Secondary | ICD-10-CM | POA: Diagnosis not present

## 2016-09-13 DIAGNOSIS — Z113 Encounter for screening for infections with a predominantly sexual mode of transmission: Secondary | ICD-10-CM | POA: Insufficient documentation

## 2016-09-13 DIAGNOSIS — Z0000 Encounter for general adult medical examination without abnormal findings: Secondary | ICD-10-CM | POA: Diagnosis not present

## 2016-09-18 LAB — URINE CYTOLOGY ANCILLARY ONLY
Bacterial vaginitis: NEGATIVE
CANDIDA VAGINITIS: NEGATIVE
Chlamydia: NEGATIVE
Neisseria Gonorrhea: NEGATIVE
Trichomonas: NEGATIVE

## 2016-09-20 ENCOUNTER — Ambulatory Visit (HOSPITAL_COMMUNITY)
Admission: RE | Admit: 2016-09-20 | Discharge: 2016-09-20 | Disposition: A | Discharge status: Home / Self Care | Payer: Medicare Other | Source: Ambulatory Visit | Attending: Family Medicine | Admitting: Family Medicine

## 2016-09-20 DIAGNOSIS — Z1231 Encounter for screening mammogram for malignant neoplasm of breast: Secondary | ICD-10-CM | POA: Diagnosis not present

## 2016-09-28 ENCOUNTER — Ambulatory Visit: Payer: Self-pay | Admitting: Obstetrics and Gynecology

## 2016-10-03 ENCOUNTER — Ambulatory Visit (INDEPENDENT_AMBULATORY_CARE_PROVIDER_SITE_OTHER): Payer: Medicare Other | Admitting: Obstetrics and Gynecology

## 2016-10-03 ENCOUNTER — Encounter: Payer: Self-pay | Admitting: Obstetrics and Gynecology

## 2016-10-03 VITALS — BP 112/88 | HR 96 | Wt 153.8 lb

## 2016-10-03 DIAGNOSIS — N898 Other specified noninflammatory disorders of vagina: Secondary | ICD-10-CM

## 2016-10-03 DIAGNOSIS — N952 Postmenopausal atrophic vaginitis: Secondary | ICD-10-CM | POA: Diagnosis not present

## 2016-10-03 MED ORDER — ESTROGENS, CONJUGATED 0.625 MG/GM VA CREA: 1.0000 | TOPICAL_CREAM | VAGINAL | 2 refills | Status: DC

## 2016-10-03 NOTE — Progress Notes (Signed)
[facility] Clinic Visit  @DATE @            Patient name: Leslie Berry MRN [id number]  Date of birth: 09/27/1940  CC & HPI:  Leslie Berry is a 76 y.o. female presenting today for Vaginal discharge. She is a 76 year old female well known to practice with pessary use times years. She uses a ring pessary herself. She is tried a Sport and exercise psychologist parous pessary and is unable to use and does not want to try again she actually complains more discharged when she does not have the pessary in. She has a sense of pelvic heaviness when she does not use a pessary and limits her physical activity she is actually quite active  ROS:  ROS   Pertinent History Reviewed:   Reviewed: Significant for Overall stable general health alert  Medical         Past Medical History:  Diagnosis Date  . Arthritis   . Diabetes mellitus   . Elevated LFTs   . GERD (gastroesophageal reflux disease)   . Gout   . Hypertension    dr bland  pcp                              Surgical Hx:    Past Surgical History:  Procedure Laterality Date  . CARPAL TUNNEL RELEASE    . CARPAL TUNNEL RELEASE  12/22/2011   Procedure: CARPAL TUNNEL RELEASE;  Surgeon: Kennieth Rad, MD;  Location: [facility] OR;  Service: Orthopedics;  Laterality: Right;  . CESAREAN SECTION    . ROTATOR CUFF REPAIR     Medications: Reviewed & Updated - see associated section                       Current Outpatient Prescriptions:  .  COLCRYS 0.6 MG tablet, Take 0.6 mg by mouth daily. , Disp: , Rfl:  .  indapamide (LOZOL) 1.25 MG tablet, Take 1.25 mg by mouth daily. , Disp: , Rfl:  .  metFORMIN (GLUCOPHAGE) 500 MG tablet, Take 500 mg by mouth daily., Disp: , Rfl:  .  metroNIDAZOLE (METROGEL) 0.75 % vaginal gel, , Disp: , Rfl:  .  omeprazole (PRILOSEC) 20 MG capsule, Take 20 mg by mouth daily., Disp: , Rfl:  .  ursodiol (ACTIGALL) 500 MG tablet, Take 500 mg by mouth 3 (three) times daily. , Disp: , Rfl:  .  acetaminophen (TYLENOL) 500 MG tablet, Take 1,000 mg by  mouth every 12 (twelve) hours as needed. Arthritis, Disp: , Rfl:  .  amLODipine-valsartan (EXFORGE) 10-320 MG per tablet, Take 1 tablet by mouth daily., Disp: , Rfl:  .  [START ON 10/04/2016] conjugated estrogens (PREMARIN) vaginal cream, Place 1 Applicatorful vaginally 3 (three) times a week. For vaginal thinning and irritation 0.5 gram= 1/4 applicator, Disp: 42.5 g, Rfl: 2 .  hydrochlorothiazide (HYDRODIURIL) 12.5 MG tablet, , Disp: , Rfl:  .  HYDROcodone-acetaminophen (NORCO/VICODIN) 5-325 MG tablet, 1 at at bedtime, or every 6 hours as needed for pain. (Patient not taking: Reported on 10/03/2016), Disp: 15 tablet, Rfl: 0 .  ursodiol (ACTIGALL) 300 MG capsule, Take 600 mg by mouth 2 (two) times daily., Disp: , Rfl:  .  Vitamin D, Ergocalciferol, (DRISDOL) 50000 UNITS CAPS, Take 50,000 Units by mouth every 7 (seven) days. Patient takes on Thursday, Disp: , Rfl:    Social History: Reviewed -  reports that she quit smoking about  33 years ago. She has never used smokeless tobacco.  Objective Findings:  Vitals: Blood pressure 112/88, pulse 96, weight 153 lb 12.8 oz (69.8 kg).  Physical Examination: General appearance - alert, well appearing, and in no distress, oriented to person, place, and time and normal appearing weight Mental status - alert, oriented to person, place, and time, normal mood, behavior, speech, dress, motor activity, and thought processes Eyes - pupils equal and reactive, extraocular eye movements intact Abdomen - soft, nontender, nondistended, no masses or organomegaly Pelvic - normal external genitalia, vulva, vagina, cervix, uterus and adnexa, VULVA: normal appearing vulva with no masses, tenderness or lesions, VAGINA: normal appearing vagina with normal color and discharge, no lesions, vaginal discharge - creamy, ADNEXA: normal adnexa in size, nontender and no masses Wet prep shows parabasal cells, no trichomoniasis  Assessment & Plan:   A:  1. Atrophic Vaginitis  P:  1.  Premarin VC hs 2-3x/wk  Given Rx and sample.

## 2016-12-06 DIAGNOSIS — I10 Essential (primary) hypertension: Secondary | ICD-10-CM | POA: Diagnosis not present

## 2016-12-06 DIAGNOSIS — M1991 Primary osteoarthritis, unspecified site: Secondary | ICD-10-CM | POA: Diagnosis not present

## 2016-12-06 DIAGNOSIS — N771 Vaginitis, vulvitis and vulvovaginitis in diseases classified elsewhere: Secondary | ICD-10-CM | POA: Diagnosis not present

## 2016-12-06 DIAGNOSIS — E1100 Type 2 diabetes mellitus with hyperosmolarity without nonketotic hyperglycemic-hyperosmolar coma (NKHHC): Secondary | ICD-10-CM | POA: Diagnosis not present

## 2016-12-06 DIAGNOSIS — M1000 Idiopathic gout, unspecified site: Secondary | ICD-10-CM | POA: Diagnosis not present

## 2017-01-03 ENCOUNTER — Ambulatory Visit: Payer: Medicare Other | Admitting: Obstetrics and Gynecology

## 2017-02-01 DIAGNOSIS — K219 Gastro-esophageal reflux disease without esophagitis: Secondary | ICD-10-CM | POA: Diagnosis not present

## 2017-02-01 DIAGNOSIS — K743 Primary biliary cirrhosis: Secondary | ICD-10-CM | POA: Diagnosis not present

## 2017-03-07 DIAGNOSIS — E119 Type 2 diabetes mellitus without complications: Secondary | ICD-10-CM | POA: Diagnosis not present

## 2017-03-07 DIAGNOSIS — G5603 Carpal tunnel syndrome, bilateral upper limbs: Secondary | ICD-10-CM | POA: Diagnosis not present

## 2017-03-07 DIAGNOSIS — I10 Essential (primary) hypertension: Secondary | ICD-10-CM | POA: Diagnosis not present

## 2017-03-28 ENCOUNTER — Ambulatory Visit (INDEPENDENT_AMBULATORY_CARE_PROVIDER_SITE_OTHER): Payer: Medicare Other | Admitting: Obstetrics and Gynecology

## 2017-03-28 ENCOUNTER — Encounter: Payer: Self-pay | Admitting: Obstetrics and Gynecology

## 2017-03-28 VITALS — BP 90/60 | HR 76 | Wt 157.0 lb

## 2017-03-28 DIAGNOSIS — N952 Postmenopausal atrophic vaginitis: Secondary | ICD-10-CM

## 2017-03-28 NOTE — Progress Notes (Signed)
Patient ID: TAKERRA Berry, female   DOB: [birth year], 75 y.o.   MRN: [id number]   [facility] Clinic Visit  @DATE @            Patient name: Leslie Berry MRN [id number]  Date of birth: 16-Sep-1940  CC & HPI:  Leslie Berry is a 76 y.o. female presenting today for follow up from prior visit and following premarin cream use. Pt reports that the premarin cream has alleviated her symptoms. Pt states that she uses the cream PRN and doesn't need a refill. Denies any other symptoms.   ROS:  ROS  No complaints,    Pertinent History Reviewed:   Reviewed: Significant for DM, HTN, Medical         Past Medical History:  Diagnosis Date  . Arthritis   . Diabetes mellitus   . Elevated LFTs   . GERD (gastroesophageal reflux disease)   . Gout   . Hypertension    dr bland  pcp                              Surgical Hx:    Past Surgical History:  Procedure Laterality Date  . CARPAL TUNNEL RELEASE    . CARPAL TUNNEL RELEASE  12/22/2011   Procedure: CARPAL TUNNEL RELEASE;  Surgeon: Kennieth Rad, MD;  Location: [facility] OR;  Service: Orthopedics;  Laterality: Right;  . CESAREAN SECTION    . ROTATOR CUFF REPAIR     Medications: Reviewed & Updated - see associated section                       Current Outpatient Prescriptions:  .  amLODipine-valsartan (EXFORGE) 10-320 MG per tablet, Take 1 tablet by mouth daily., Disp: , Rfl:  .  conjugated estrogens (PREMARIN) vaginal cream, Place 1 Applicatorful vaginally 3 (three) times a week. For vaginal thinning and irritation 0.5 gram= 1/4 applicator, Disp: 42.5 g, Rfl: 2 .  hydrochlorothiazide (HYDRODIURIL) 12.5 MG tablet, , Disp: , Rfl:  .  indapamide (LOZOL) 1.25 MG tablet, Take 1.25 mg by mouth daily. , Disp: , Rfl:  .  metFORMIN (GLUCOPHAGE) 500 MG tablet, Take 500 mg by mouth daily., Disp: , Rfl:  .  omeprazole (PRILOSEC) 20 MG capsule, Take 20 mg by mouth daily., Disp: , Rfl:  .  ursodiol (ACTIGALL) 500 MG tablet, Take 500 mg by mouth 3 (three) times  daily. , Disp: , Rfl:  .  Vitamin D, Ergocalciferol, (DRISDOL) 50000 UNITS CAPS, Take 50,000 Units by mouth every 7 (seven) days. Patient takes on Thursday, Disp: , Rfl:  .  acetaminophen (TYLENOL) 500 MG tablet, Take 1,000 mg by mouth every 12 (twelve) hours as needed. Arthritis, Disp: , Rfl:  .  COLCRYS 0.6 MG tablet, Take 0.6 mg by mouth daily. , Disp: , Rfl:  .  HYDROcodone-acetaminophen (NORCO/VICODIN) 5-325 MG tablet, 1 at at bedtime, or every 6 hours as needed for pain. (Patient not taking: Reported on 10/03/2016), Disp: 15 tablet, Rfl: 0 .  metroNIDAZOLE (METROGEL) 0.75 % vaginal gel, , Disp: , Rfl:  .  ursodiol (ACTIGALL) 300 MG capsule, Take 600 mg by mouth 2 (two) times daily., Disp: , Rfl:    Social History: Reviewed -  reports that she quit smoking about 33 years ago. She has never used smokeless tobacco.  Objective Findings:  Vitals: Blood pressure 90/60, pulse 76, weight 157 lb (71.2 kg).  Physical  Examination: discussion only   Discussion: 1. Discussed with pt risks and benefits of premarin cream use  At end of discussion, pt had opportunity to ask questions and has no further questions at this time.   Specific discussion of premarin cream use as noted above. Greater than 50% was spent in counseling, coordination of care, and documentation with the patient.    Total time greater than: 15 minutes.     Assessment & Plan:   A:  1. Atrophic Vaginitis  P:  1. Premarin VC 2-3 x a week 2. Follow up PRN    By signing my name below, I, Soijett Blue, attest that this documentation has been prepared under the direction and in the presence of Tilda BurrowFerguson, Renn Dirocco V, MD. Electronically Signed: Soijett Blue, ED Scribe. 03/28/17. 12:20 PM.  .I personally performed the services described in this documentation, which was SCRIBED in my presence. The recorded information has been reviewed and considered accurate. It has been edited as necessary during review. Tilda BurrowFERGUSON,Gaelen Brager V,  MD

## 2017-05-01 DIAGNOSIS — G5602 Carpal tunnel syndrome, left upper limb: Secondary | ICD-10-CM | POA: Diagnosis not present

## 2017-05-01 DIAGNOSIS — G5601 Carpal tunnel syndrome, right upper limb: Secondary | ICD-10-CM | POA: Diagnosis not present

## 2017-05-21 DIAGNOSIS — G5601 Carpal tunnel syndrome, right upper limb: Secondary | ICD-10-CM | POA: Diagnosis not present

## 2017-06-05 DIAGNOSIS — G5601 Carpal tunnel syndrome, right upper limb: Secondary | ICD-10-CM | POA: Diagnosis not present

## 2017-06-05 DIAGNOSIS — G5602 Carpal tunnel syndrome, left upper limb: Secondary | ICD-10-CM | POA: Diagnosis not present

## 2017-06-12 DIAGNOSIS — H5203 Hypermetropia, bilateral: Secondary | ICD-10-CM | POA: Diagnosis not present

## 2017-06-12 DIAGNOSIS — H52203 Unspecified astigmatism, bilateral: Secondary | ICD-10-CM | POA: Diagnosis not present

## 2017-06-12 DIAGNOSIS — E119 Type 2 diabetes mellitus without complications: Secondary | ICD-10-CM | POA: Diagnosis not present

## 2017-06-12 DIAGNOSIS — H25013 Cortical age-related cataract, bilateral: Secondary | ICD-10-CM | POA: Diagnosis not present

## 2017-06-12 DIAGNOSIS — H2513 Age-related nuclear cataract, bilateral: Secondary | ICD-10-CM | POA: Diagnosis not present

## 2017-07-06 DIAGNOSIS — I10 Essential (primary) hypertension: Secondary | ICD-10-CM | POA: Diagnosis not present

## 2017-07-06 DIAGNOSIS — M1000 Idiopathic gout, unspecified site: Secondary | ICD-10-CM | POA: Diagnosis not present

## 2017-07-06 DIAGNOSIS — E119 Type 2 diabetes mellitus without complications: Secondary | ICD-10-CM | POA: Diagnosis not present

## 2017-07-09 ENCOUNTER — Ambulatory Visit (HOSPITAL_COMMUNITY)
Admission: RE | Admit: 2017-07-09 | Discharge: 2017-07-09 | Disposition: A | Discharge status: Home / Self Care | Payer: Medicare Other | Source: Ambulatory Visit | Attending: Family Medicine | Admitting: Family Medicine

## 2017-07-09 ENCOUNTER — Other Ambulatory Visit (HOSPITAL_COMMUNITY): Payer: Self-pay | Admitting: Family Medicine

## 2017-07-09 DIAGNOSIS — M79672 Pain in left foot: Secondary | ICD-10-CM | POA: Diagnosis not present

## 2017-07-09 DIAGNOSIS — M79671 Pain in right foot: Secondary | ICD-10-CM | POA: Diagnosis not present

## 2017-07-09 DIAGNOSIS — M150 Primary generalized (osteo)arthritis: Secondary | ICD-10-CM | POA: Insufficient documentation

## 2017-07-09 DIAGNOSIS — M159 Polyosteoarthritis, unspecified: Secondary | ICD-10-CM

## 2017-08-07 DIAGNOSIS — E1100 Type 2 diabetes mellitus with hyperosmolarity without nonketotic hyperglycemic-hyperosmolar coma (NKHHC): Secondary | ICD-10-CM | POA: Diagnosis not present

## 2017-08-07 DIAGNOSIS — I10 Essential (primary) hypertension: Secondary | ICD-10-CM | POA: Diagnosis not present

## 2017-08-16 DIAGNOSIS — M2141 Flat foot [pes planus] (acquired), right foot: Secondary | ICD-10-CM | POA: Diagnosis not present

## 2017-08-16 DIAGNOSIS — M2142 Flat foot [pes planus] (acquired), left foot: Secondary | ICD-10-CM | POA: Diagnosis not present

## 2017-08-16 DIAGNOSIS — M79672 Pain in left foot: Secondary | ICD-10-CM | POA: Diagnosis not present

## 2017-08-16 DIAGNOSIS — M79671 Pain in right foot: Secondary | ICD-10-CM | POA: Diagnosis not present

## 2017-09-06 ENCOUNTER — Other Ambulatory Visit (HOSPITAL_COMMUNITY): Payer: Self-pay | Admitting: Family Medicine

## 2017-09-06 DIAGNOSIS — Z1231 Encounter for screening mammogram for malignant neoplasm of breast: Secondary | ICD-10-CM

## 2017-09-24 ENCOUNTER — Ambulatory Visit (HOSPITAL_COMMUNITY)
Admission: RE | Admit: 2017-09-24 | Discharge: 2017-09-24 | Disposition: A | Discharge status: Home / Self Care | Payer: Medicare Other | Source: Ambulatory Visit | Attending: Family Medicine | Admitting: Family Medicine

## 2017-09-24 DIAGNOSIS — Z1231 Encounter for screening mammogram for malignant neoplasm of breast: Secondary | ICD-10-CM | POA: Insufficient documentation

## 2017-10-03 ENCOUNTER — Other Ambulatory Visit: Payer: Self-pay

## 2017-10-03 DIAGNOSIS — M1A0311 Idiopathic chronic gout, right wrist, with tophus (tophi): Secondary | ICD-10-CM | POA: Diagnosis not present

## 2017-10-03 DIAGNOSIS — M65831 Other synovitis and tenosynovitis, right forearm: Secondary | ICD-10-CM | POA: Diagnosis not present

## 2017-10-03 DIAGNOSIS — G5601 Carpal tunnel syndrome, right upper limb: Secondary | ICD-10-CM | POA: Diagnosis not present

## 2017-10-03 DIAGNOSIS — M659 Synovitis and tenosynovitis, unspecified: Secondary | ICD-10-CM | POA: Diagnosis not present

## 2017-10-16 DIAGNOSIS — G5601 Carpal tunnel syndrome, right upper limb: Secondary | ICD-10-CM | POA: Diagnosis not present

## 2017-10-22 DIAGNOSIS — I10 Essential (primary) hypertension: Secondary | ICD-10-CM | POA: Diagnosis not present

## 2017-10-22 DIAGNOSIS — M1000 Idiopathic gout, unspecified site: Secondary | ICD-10-CM | POA: Diagnosis not present

## 2017-10-22 DIAGNOSIS — E118 Type 2 diabetes mellitus with unspecified complications: Secondary | ICD-10-CM | POA: Diagnosis not present

## 2017-10-22 DIAGNOSIS — E119 Type 2 diabetes mellitus without complications: Secondary | ICD-10-CM | POA: Diagnosis not present

## 2018-02-04 DIAGNOSIS — G5601 Carpal tunnel syndrome, right upper limb: Secondary | ICD-10-CM | POA: Diagnosis not present

## 2018-02-05 DIAGNOSIS — K743 Primary biliary cirrhosis: Secondary | ICD-10-CM | POA: Diagnosis not present

## 2018-02-05 DIAGNOSIS — Z800 Family history of malignant neoplasm of digestive organs: Secondary | ICD-10-CM | POA: Diagnosis not present

## 2018-02-05 DIAGNOSIS — K5730 Diverticulosis of large intestine without perforation or abscess without bleeding: Secondary | ICD-10-CM | POA: Diagnosis not present

## 2018-02-19 DIAGNOSIS — E1100 Type 2 diabetes mellitus with hyperosmolarity without nonketotic hyperglycemic-hyperosmolar coma (NKHHC): Secondary | ICD-10-CM | POA: Diagnosis not present

## 2018-02-19 DIAGNOSIS — I10 Essential (primary) hypertension: Secondary | ICD-10-CM | POA: Diagnosis not present

## 2018-02-19 DIAGNOSIS — M1991 Primary osteoarthritis, unspecified site: Secondary | ICD-10-CM | POA: Diagnosis not present

## 2018-02-19 DIAGNOSIS — I110 Hypertensive heart disease with heart failure: Secondary | ICD-10-CM | POA: Diagnosis not present

## 2018-03-12 DIAGNOSIS — I110 Hypertensive heart disease with heart failure: Secondary | ICD-10-CM | POA: Diagnosis not present

## 2018-03-13 ENCOUNTER — Other Ambulatory Visit (HOSPITAL_COMMUNITY): Payer: Self-pay | Admitting: Family Medicine

## 2018-03-18 ENCOUNTER — Ambulatory Visit (HOSPITAL_COMMUNITY)
Admission: RE | Admit: 2018-03-18 | Discharge: 2018-03-18 | Disposition: A | Discharge status: Home / Self Care | Payer: Medicare Other | Source: Ambulatory Visit | Attending: Family Medicine | Admitting: Family Medicine

## 2018-03-18 DIAGNOSIS — N281 Cyst of kidney, acquired: Secondary | ICD-10-CM | POA: Diagnosis not present

## 2018-04-17 DIAGNOSIS — I110 Hypertensive heart disease with heart failure: Secondary | ICD-10-CM | POA: Diagnosis not present

## 2018-04-17 DIAGNOSIS — E119 Type 2 diabetes mellitus without complications: Secondary | ICD-10-CM | POA: Diagnosis not present

## 2018-05-29 DIAGNOSIS — Z0000 Encounter for general adult medical examination without abnormal findings: Secondary | ICD-10-CM | POA: Diagnosis not present

## 2018-05-29 DIAGNOSIS — I10 Essential (primary) hypertension: Secondary | ICD-10-CM | POA: Diagnosis not present

## 2018-06-18 DIAGNOSIS — H524 Presbyopia: Secondary | ICD-10-CM | POA: Diagnosis not present

## 2018-06-18 DIAGNOSIS — H52203 Unspecified astigmatism, bilateral: Secondary | ICD-10-CM | POA: Diagnosis not present

## 2018-06-18 DIAGNOSIS — H5203 Hypermetropia, bilateral: Secondary | ICD-10-CM | POA: Diagnosis not present

## 2018-06-18 DIAGNOSIS — H2513 Age-related nuclear cataract, bilateral: Secondary | ICD-10-CM | POA: Diagnosis not present

## 2018-06-18 DIAGNOSIS — E119 Type 2 diabetes mellitus without complications: Secondary | ICD-10-CM | POA: Diagnosis not present

## 2018-07-04 ENCOUNTER — Other Ambulatory Visit: Payer: Self-pay

## 2018-07-04 NOTE — Patient Outreach (Signed)
[facility]  07/04/2018  Dow AdolphMildred W Geary 04-04-1941 [id number]   Medication Adherence call to Mrs. Ruffin FrederickMildred Bennette left a message for patient to call back patient is due on Metformin 500 mg. Mrs. Winebarger is showing past due under [facility].   Lillia AbedAna Ollison-Moran CPhT Pharmacy Technician [facility] Care Management Direct Dial [phone]  Fax [phone] [email or web address]

## 2018-07-11 DIAGNOSIS — M109 Gout, unspecified: Secondary | ICD-10-CM | POA: Diagnosis not present

## 2018-07-11 DIAGNOSIS — M7021 Olecranon bursitis, right elbow: Secondary | ICD-10-CM | POA: Diagnosis not present

## 2018-07-11 DIAGNOSIS — G5601 Carpal tunnel syndrome, right upper limb: Secondary | ICD-10-CM | POA: Diagnosis not present

## 2018-08-15 DIAGNOSIS — G5601 Carpal tunnel syndrome, right upper limb: Secondary | ICD-10-CM | POA: Diagnosis not present

## 2018-08-15 DIAGNOSIS — M67431 Ganglion, right wrist: Secondary | ICD-10-CM | POA: Diagnosis not present

## 2018-08-15 DIAGNOSIS — M109 Gout, unspecified: Secondary | ICD-10-CM | POA: Diagnosis not present

## 2018-08-29 DIAGNOSIS — I10 Essential (primary) hypertension: Secondary | ICD-10-CM | POA: Diagnosis not present

## 2018-08-29 DIAGNOSIS — F5102 Adjustment insomnia: Secondary | ICD-10-CM | POA: Diagnosis not present

## 2018-08-29 DIAGNOSIS — M109 Gout, unspecified: Secondary | ICD-10-CM | POA: Diagnosis not present

## 2018-08-29 DIAGNOSIS — E118 Type 2 diabetes mellitus with unspecified complications: Secondary | ICD-10-CM | POA: Diagnosis not present

## 2018-08-29 DIAGNOSIS — M67431 Ganglion, right wrist: Secondary | ICD-10-CM | POA: Diagnosis not present

## 2018-08-30 ENCOUNTER — Other Ambulatory Visit (HOSPITAL_COMMUNITY): Payer: Self-pay | Admitting: Family Medicine

## 2018-08-30 DIAGNOSIS — Z1231 Encounter for screening mammogram for malignant neoplasm of breast: Secondary | ICD-10-CM

## 2018-09-30 ENCOUNTER — Ambulatory Visit (HOSPITAL_COMMUNITY)
Admission: RE | Admit: 2018-09-30 | Discharge: 2018-09-30 | Disposition: A | Discharge status: Home / Self Care | Payer: Medicare HMO | Source: Ambulatory Visit | Attending: Family Medicine | Admitting: Family Medicine

## 2018-09-30 DIAGNOSIS — Z1231 Encounter for screening mammogram for malignant neoplasm of breast: Secondary | ICD-10-CM | POA: Diagnosis not present

## 2018-12-31 DIAGNOSIS — E1169 Type 2 diabetes mellitus with other specified complication: Secondary | ICD-10-CM | POA: Diagnosis not present

## 2018-12-31 DIAGNOSIS — I10 Essential (primary) hypertension: Secondary | ICD-10-CM | POA: Diagnosis not present

## 2018-12-31 DIAGNOSIS — M109 Gout, unspecified: Secondary | ICD-10-CM | POA: Diagnosis not present

## 2019-01-17 DIAGNOSIS — E210 Primary hyperparathyroidism: Secondary | ICD-10-CM | POA: Diagnosis not present

## 2019-01-17 DIAGNOSIS — M109 Gout, unspecified: Secondary | ICD-10-CM | POA: Diagnosis not present

## 2019-01-17 DIAGNOSIS — Z13820 Encounter for screening for osteoporosis: Secondary | ICD-10-CM | POA: Diagnosis not present

## 2019-01-17 DIAGNOSIS — I10 Essential (primary) hypertension: Secondary | ICD-10-CM | POA: Diagnosis not present

## 2019-01-17 DIAGNOSIS — Z780 Asymptomatic menopausal state: Secondary | ICD-10-CM | POA: Diagnosis not present

## 2019-01-23 DIAGNOSIS — E210 Primary hyperparathyroidism: Secondary | ICD-10-CM | POA: Diagnosis not present

## 2019-02-06 DIAGNOSIS — E210 Primary hyperparathyroidism: Secondary | ICD-10-CM | POA: Diagnosis not present

## 2019-02-06 DIAGNOSIS — Z13820 Encounter for screening for osteoporosis: Secondary | ICD-10-CM | POA: Diagnosis not present

## 2019-02-06 DIAGNOSIS — M109 Gout, unspecified: Secondary | ICD-10-CM | POA: Diagnosis not present

## 2019-02-06 DIAGNOSIS — I10 Essential (primary) hypertension: Secondary | ICD-10-CM | POA: Diagnosis not present

## 2019-02-06 DIAGNOSIS — Z780 Asymptomatic menopausal state: Secondary | ICD-10-CM | POA: Diagnosis not present

## 2019-02-10 ENCOUNTER — Other Ambulatory Visit (HOSPITAL_COMMUNITY): Payer: Self-pay | Admitting: Family Medicine

## 2019-02-10 DIAGNOSIS — Z780 Asymptomatic menopausal state: Secondary | ICD-10-CM

## 2019-02-14 ENCOUNTER — Ambulatory Visit (HOSPITAL_COMMUNITY)
Admission: RE | Admit: 2019-02-14 | Discharge: 2019-02-14 | Disposition: A | Discharge status: Home / Self Care | Payer: Medicare HMO | Source: Ambulatory Visit | Attending: Family Medicine | Admitting: Family Medicine

## 2019-02-14 ENCOUNTER — Other Ambulatory Visit: Payer: Self-pay

## 2019-02-14 DIAGNOSIS — Z780 Asymptomatic menopausal state: Secondary | ICD-10-CM | POA: Insufficient documentation

## 2019-02-14 DIAGNOSIS — M810 Age-related osteoporosis without current pathological fracture: Secondary | ICD-10-CM | POA: Diagnosis not present

## 2019-02-14 DIAGNOSIS — M85851 Other specified disorders of bone density and structure, right thigh: Secondary | ICD-10-CM | POA: Diagnosis not present

## 2019-02-17 ENCOUNTER — Other Ambulatory Visit (HOSPITAL_COMMUNITY): Payer: Medicare HMO

## 2019-02-20 DIAGNOSIS — K5730 Diverticulosis of large intestine without perforation or abscess without bleeding: Secondary | ICD-10-CM | POA: Diagnosis not present

## 2019-02-20 DIAGNOSIS — K743 Primary biliary cirrhosis: Secondary | ICD-10-CM | POA: Diagnosis not present

## 2019-02-20 DIAGNOSIS — K219 Gastro-esophageal reflux disease without esophagitis: Secondary | ICD-10-CM | POA: Diagnosis not present

## 2019-02-20 DIAGNOSIS — Z800 Family history of malignant neoplasm of digestive organs: Secondary | ICD-10-CM | POA: Diagnosis not present

## 2019-03-15 DIAGNOSIS — R69 Illness, unspecified: Secondary | ICD-10-CM | POA: Diagnosis not present

## 2019-04-02 DIAGNOSIS — I10 Essential (primary) hypertension: Secondary | ICD-10-CM | POA: Diagnosis not present

## 2019-04-02 DIAGNOSIS — Z13820 Encounter for screening for osteoporosis: Secondary | ICD-10-CM | POA: Diagnosis not present

## 2019-04-02 DIAGNOSIS — Z6826 Body mass index (BMI) 26.0-26.9, adult: Secondary | ICD-10-CM | POA: Diagnosis not present

## 2019-04-02 DIAGNOSIS — E1100 Type 2 diabetes mellitus with hyperosmolarity without nonketotic hyperglycemic-hyperosmolar coma (NKHHC): Secondary | ICD-10-CM | POA: Diagnosis not present

## 2019-04-02 DIAGNOSIS — E210 Primary hyperparathyroidism: Secondary | ICD-10-CM | POA: Diagnosis not present

## 2019-04-02 DIAGNOSIS — M109 Gout, unspecified: Secondary | ICD-10-CM | POA: Diagnosis not present

## 2019-05-12 DIAGNOSIS — Z20828 Contact with and (suspected) exposure to other viral communicable diseases: Secondary | ICD-10-CM | POA: Diagnosis not present

## 2019-06-10 ENCOUNTER — Other Ambulatory Visit: Payer: Self-pay | Admitting: *Deleted

## 2019-06-10 DIAGNOSIS — Z20822 Contact with and (suspected) exposure to covid-19: Secondary | ICD-10-CM

## 2019-06-12 DIAGNOSIS — M1990 Unspecified osteoarthritis, unspecified site: Secondary | ICD-10-CM | POA: Diagnosis not present

## 2019-06-12 DIAGNOSIS — M1991 Primary osteoarthritis, unspecified site: Secondary | ICD-10-CM | POA: Diagnosis not present

## 2019-06-12 DIAGNOSIS — E1169 Type 2 diabetes mellitus with other specified complication: Secondary | ICD-10-CM | POA: Diagnosis not present

## 2019-06-12 DIAGNOSIS — R69 Illness, unspecified: Secondary | ICD-10-CM | POA: Diagnosis not present

## 2019-06-12 DIAGNOSIS — M1310 Monoarthritis, not elsewhere classified, unspecified site: Secondary | ICD-10-CM | POA: Diagnosis not present

## 2019-06-12 DIAGNOSIS — M1000 Idiopathic gout, unspecified site: Secondary | ICD-10-CM | POA: Diagnosis not present

## 2019-06-12 LAB — NOVEL CORONAVIRUS, NAA: SARS-CoV-2, NAA: NOT DETECTED

## 2019-06-13 ENCOUNTER — Other Ambulatory Visit (HOSPITAL_COMMUNITY): Payer: Self-pay | Admitting: Family Medicine

## 2019-06-13 ENCOUNTER — Ambulatory Visit (HOSPITAL_COMMUNITY)
Admission: RE | Admit: 2019-06-13 | Discharge: 2019-06-13 | Disposition: A | Discharge status: Home / Self Care | Payer: Medicare HMO | Source: Ambulatory Visit | Attending: Family Medicine | Admitting: Family Medicine

## 2019-06-13 ENCOUNTER — Other Ambulatory Visit: Payer: Self-pay

## 2019-06-13 DIAGNOSIS — M10171 Lead-induced gout, right ankle and foot: Secondary | ICD-10-CM | POA: Insufficient documentation

## 2019-06-13 DIAGNOSIS — T560X1A Toxic effect of lead and its compounds, accidental (unintentional), initial encounter: Secondary | ICD-10-CM | POA: Insufficient documentation

## 2019-06-13 DIAGNOSIS — M109 Gout, unspecified: Secondary | ICD-10-CM | POA: Diagnosis not present

## 2019-06-13 DIAGNOSIS — M19071 Primary osteoarthritis, right ankle and foot: Secondary | ICD-10-CM | POA: Diagnosis not present

## 2019-06-16 DIAGNOSIS — M1000 Idiopathic gout, unspecified site: Secondary | ICD-10-CM | POA: Diagnosis not present

## 2019-06-16 DIAGNOSIS — R141 Gas pain: Secondary | ICD-10-CM | POA: Diagnosis not present

## 2019-06-16 DIAGNOSIS — I10 Essential (primary) hypertension: Secondary | ICD-10-CM | POA: Diagnosis not present

## 2019-06-16 DIAGNOSIS — M1991 Primary osteoarthritis, unspecified site: Secondary | ICD-10-CM | POA: Diagnosis not present

## 2019-06-23 DIAGNOSIS — H524 Presbyopia: Secondary | ICD-10-CM | POA: Diagnosis not present

## 2019-06-23 DIAGNOSIS — E119 Type 2 diabetes mellitus without complications: Secondary | ICD-10-CM | POA: Diagnosis not present

## 2019-06-23 DIAGNOSIS — H5203 Hypermetropia, bilateral: Secondary | ICD-10-CM | POA: Diagnosis not present

## 2019-06-23 DIAGNOSIS — H25813 Combined forms of age-related cataract, bilateral: Secondary | ICD-10-CM | POA: Diagnosis not present

## 2019-06-23 DIAGNOSIS — H52203 Unspecified astigmatism, bilateral: Secondary | ICD-10-CM | POA: Diagnosis not present

## 2019-07-15 DIAGNOSIS — H269 Unspecified cataract: Secondary | ICD-10-CM | POA: Diagnosis not present

## 2019-07-15 DIAGNOSIS — K219 Gastro-esophageal reflux disease without esophagitis: Secondary | ICD-10-CM | POA: Diagnosis not present

## 2019-07-15 DIAGNOSIS — M109 Gout, unspecified: Secondary | ICD-10-CM | POA: Diagnosis not present

## 2019-07-15 DIAGNOSIS — M810 Age-related osteoporosis without current pathological fracture: Secondary | ICD-10-CM | POA: Diagnosis not present

## 2019-07-15 DIAGNOSIS — I10 Essential (primary) hypertension: Secondary | ICD-10-CM | POA: Diagnosis not present

## 2019-07-15 DIAGNOSIS — G8929 Other chronic pain: Secondary | ICD-10-CM | POA: Diagnosis not present

## 2019-07-15 DIAGNOSIS — K769 Liver disease, unspecified: Secondary | ICD-10-CM | POA: Diagnosis not present

## 2019-07-15 DIAGNOSIS — Z7982 Long term (current) use of aspirin: Secondary | ICD-10-CM | POA: Diagnosis not present

## 2019-07-15 DIAGNOSIS — R7303 Prediabetes: Secondary | ICD-10-CM | POA: Diagnosis not present

## 2019-07-15 DIAGNOSIS — R32 Unspecified urinary incontinence: Secondary | ICD-10-CM | POA: Diagnosis not present

## 2019-08-04 DIAGNOSIS — Z0000 Encounter for general adult medical examination without abnormal findings: Secondary | ICD-10-CM | POA: Diagnosis not present

## 2019-08-04 DIAGNOSIS — N390 Urinary tract infection, site not specified: Secondary | ICD-10-CM | POA: Diagnosis not present

## 2019-08-04 DIAGNOSIS — E1169 Type 2 diabetes mellitus with other specified complication: Secondary | ICD-10-CM | POA: Diagnosis not present

## 2019-08-04 DIAGNOSIS — N399 Disorder of urinary system, unspecified: Secondary | ICD-10-CM | POA: Diagnosis not present

## 2019-08-04 DIAGNOSIS — I10 Essential (primary) hypertension: Secondary | ICD-10-CM | POA: Diagnosis not present

## 2019-08-27 ENCOUNTER — Other Ambulatory Visit (HOSPITAL_COMMUNITY): Payer: Self-pay | Admitting: Family Medicine

## 2019-08-27 DIAGNOSIS — Z1231 Encounter for screening mammogram for malignant neoplasm of breast: Secondary | ICD-10-CM

## 2019-09-04 DIAGNOSIS — R6883 Chills (without fever): Secondary | ICD-10-CM | POA: Diagnosis not present

## 2019-09-04 DIAGNOSIS — E210 Primary hyperparathyroidism: Secondary | ICD-10-CM | POA: Diagnosis not present

## 2019-09-04 DIAGNOSIS — M109 Gout, unspecified: Secondary | ICD-10-CM | POA: Diagnosis not present

## 2019-09-04 DIAGNOSIS — M130 Polyarthritis, unspecified: Secondary | ICD-10-CM | POA: Diagnosis not present

## 2019-09-04 DIAGNOSIS — I10 Essential (primary) hypertension: Secondary | ICD-10-CM | POA: Diagnosis not present

## 2019-09-04 DIAGNOSIS — E1169 Type 2 diabetes mellitus with other specified complication: Secondary | ICD-10-CM | POA: Diagnosis not present

## 2019-10-03 ENCOUNTER — Ambulatory Visit (HOSPITAL_COMMUNITY): Payer: Medicare HMO

## 2019-11-05 ENCOUNTER — Other Ambulatory Visit: Payer: Self-pay

## 2019-11-05 ENCOUNTER — Ambulatory Visit (HOSPITAL_COMMUNITY)
Admission: RE | Admit: 2019-11-05 | Discharge: 2019-11-05 | Disposition: A | Discharge status: Home / Self Care | Payer: Medicare HMO | Source: Ambulatory Visit | Attending: Family Medicine | Admitting: Family Medicine

## 2019-11-05 DIAGNOSIS — Z1231 Encounter for screening mammogram for malignant neoplasm of breast: Secondary | ICD-10-CM | POA: Insufficient documentation

## 2019-11-21 DIAGNOSIS — M1991 Primary osteoarthritis, unspecified site: Secondary | ICD-10-CM | POA: Diagnosis not present

## 2019-11-21 DIAGNOSIS — E1100 Type 2 diabetes mellitus with hyperosmolarity without nonketotic hyperglycemic-hyperosmolar coma (NKHHC): Secondary | ICD-10-CM | POA: Diagnosis not present

## 2019-11-21 DIAGNOSIS — I10 Essential (primary) hypertension: Secondary | ICD-10-CM | POA: Diagnosis not present

## 2019-11-21 DIAGNOSIS — M109 Gout, unspecified: Secondary | ICD-10-CM | POA: Diagnosis not present

## 2019-12-02 ENCOUNTER — Other Ambulatory Visit: Payer: Self-pay

## 2019-12-02 ENCOUNTER — Ambulatory Visit (HOSPITAL_COMMUNITY)
Admission: RE | Admit: 2019-12-02 | Discharge: 2019-12-02 | Disposition: A | Discharge status: Home / Self Care | Payer: Medicare HMO | Source: Ambulatory Visit | Attending: Family Medicine | Admitting: Family Medicine

## 2019-12-02 ENCOUNTER — Other Ambulatory Visit (HOSPITAL_COMMUNITY): Payer: Self-pay | Admitting: Family Medicine

## 2019-12-02 DIAGNOSIS — M109 Gout, unspecified: Secondary | ICD-10-CM | POA: Diagnosis not present

## 2019-12-02 DIAGNOSIS — M25661 Stiffness of right knee, not elsewhere classified: Secondary | ICD-10-CM

## 2019-12-02 DIAGNOSIS — M25561 Pain in right knee: Secondary | ICD-10-CM | POA: Diagnosis not present

## 2019-12-02 DIAGNOSIS — I10 Essential (primary) hypertension: Secondary | ICD-10-CM | POA: Diagnosis not present

## 2019-12-02 DIAGNOSIS — E1169 Type 2 diabetes mellitus with other specified complication: Secondary | ICD-10-CM | POA: Diagnosis not present

## 2019-12-02 DIAGNOSIS — E782 Mixed hyperlipidemia: Secondary | ICD-10-CM | POA: Diagnosis not present

## 2019-12-11 DIAGNOSIS — I10 Essential (primary) hypertension: Secondary | ICD-10-CM | POA: Diagnosis not present

## 2019-12-11 DIAGNOSIS — I739 Peripheral vascular disease, unspecified: Secondary | ICD-10-CM | POA: Diagnosis not present

## 2020-01-01 DIAGNOSIS — H2513 Age-related nuclear cataract, bilateral: Secondary | ICD-10-CM | POA: Diagnosis not present

## 2020-01-01 DIAGNOSIS — H25013 Cortical age-related cataract, bilateral: Secondary | ICD-10-CM | POA: Diagnosis not present

## 2020-01-14 DIAGNOSIS — H25813 Combined forms of age-related cataract, bilateral: Secondary | ICD-10-CM | POA: Diagnosis not present

## 2020-01-14 DIAGNOSIS — H25011 Cortical age-related cataract, right eye: Secondary | ICD-10-CM | POA: Diagnosis not present

## 2020-01-14 DIAGNOSIS — H2511 Age-related nuclear cataract, right eye: Secondary | ICD-10-CM | POA: Diagnosis not present

## 2020-02-13 ENCOUNTER — Other Ambulatory Visit: Payer: Self-pay

## 2020-02-13 ENCOUNTER — Ambulatory Visit (INDEPENDENT_AMBULATORY_CARE_PROVIDER_SITE_OTHER): Payer: Medicare HMO | Admitting: Obstetrics and Gynecology

## 2020-02-13 ENCOUNTER — Encounter: Payer: Self-pay | Admitting: Obstetrics and Gynecology

## 2020-02-13 VITALS — BP 135/83 | HR 94 | Ht 65.0 in | Wt 158.6 lb

## 2020-02-13 DIAGNOSIS — N993 Prolapse of vaginal vault after hysterectomy: Secondary | ICD-10-CM | POA: Diagnosis not present

## 2020-02-13 NOTE — Progress Notes (Signed)
Patient ID: Leslie Berry, female   DOB: Jan 08, 1941, 79 y.o.   MRN: [id number]  GYNECOLOGY CLINIC PROGRESS NOTE  The patient presented today for a pessary check because it keeps slipping out. It has fallen into the toilet a couple of times during bowel movements and she cleans it and puts it back in. She reports no vaginal bleeding or discharge.   The patient currently uses a ring pessary. The patient was fitted with a Gelhorn 2.25 pessary today. Will order 2.0- inch Gellhorn pessary so that she can remove and insert it easily. ? The patient should return on 02/18/2020 to have her new pessary inserted.  By signing my name below, I, Pietro Cassis, attest that this documentation has been prepared under the direction and in the presence of Tilda Burrow, MD. Electronically Signed: Pietro Cassis, Medical Scribe. 02/13/20. 10:20 AM.  I personally performed the services described in this documentation, which was SCRIBED in my presence. The recorded information has been reviewed and considered accurate. It has been edited as necessary during review. Tilda Burrow, MD

## 2020-02-18 ENCOUNTER — Encounter: Payer: Medicare HMO | Admitting: Obstetrics and Gynecology

## 2020-03-03 DIAGNOSIS — H2512 Age-related nuclear cataract, left eye: Secondary | ICD-10-CM | POA: Diagnosis not present

## 2020-03-08 ENCOUNTER — Ambulatory Visit (INDEPENDENT_AMBULATORY_CARE_PROVIDER_SITE_OTHER): Payer: Medicare HMO | Admitting: Obstetrics and Gynecology

## 2020-03-08 ENCOUNTER — Encounter: Payer: Self-pay | Admitting: Obstetrics and Gynecology

## 2020-03-08 ENCOUNTER — Other Ambulatory Visit: Payer: Self-pay

## 2020-03-08 VITALS — BP 124/78 | HR 95 | Ht 65.0 in | Wt 158.0 lb

## 2020-03-08 DIAGNOSIS — Z4689 Encounter for fitting and adjustment of other specified devices: Secondary | ICD-10-CM

## 2020-03-08 DIAGNOSIS — N993 Prolapse of vaginal vault after hysterectomy: Secondary | ICD-10-CM | POA: Diagnosis not present

## 2020-03-08 NOTE — Progress Notes (Signed)
Patient ID: Leslie Berry, female   DOB: 01-01-1941, 79 y.o.   MRN: [id number]  GYNECOLOGY CLINIC PROGRESS NOTE  The patient presented today for a pessary insertion. She reports no vaginal bleeding or discharge. She denies pelvic discomfort and difficulty urinating or moving her bowels. ?The patient's 2.0 Gellhorn pessary was inserted without complications. The patient should return in 6 months for a pessary check and continue to use vaginal estrogen cream weekly as prescribed.  By signing my name below, I, Pietro Cassis, attest that this documentation has been prepared under the direction and in the presence of Tilda Burrow, MD. Electronically Signed: Pietro Cassis, Medical Scribe. 03/08/20. 8:42 AM.  I personally performed the services described in this documentation, which was SCRIBED in my presence. The recorded information has been reviewed and considered accurate. It has been edited as necessary during review. Tilda Burrow, MD

## 2020-03-09 ENCOUNTER — Ambulatory Visit (INDEPENDENT_AMBULATORY_CARE_PROVIDER_SITE_OTHER): Payer: Medicare HMO | Admitting: Obstetrics and Gynecology

## 2020-03-09 ENCOUNTER — Encounter: Payer: Self-pay | Admitting: Obstetrics and Gynecology

## 2020-03-09 VITALS — BP 135/84 | HR 94 | Ht 64.0 in | Wt 158.0 lb

## 2020-03-09 DIAGNOSIS — N993 Prolapse of vaginal vault after hysterectomy: Secondary | ICD-10-CM

## 2020-03-09 DIAGNOSIS — Z4689 Encounter for fitting and adjustment of other specified devices: Secondary | ICD-10-CM

## 2020-03-09 NOTE — Progress Notes (Addendum)
PATIENT ID: Leslie Berry, female     DOB: 1940/09/01, 79 y.o.     MRN: [id number]   GYNECOLOGY CLINIC PROGRESS NOTE  The patient presented today for a pessary check. She reports no vaginal bleeding or discharge. She denies pelvic discomfort and difficulty urinating or moving her bowels. ?The patient's 2.0 in gellhorn pessary with drain was removed, cleaned and replaced with a Gellhorn 2.25 inch pessary with drain without complications. Speculum examination revealed normal vaginal mucosa with no lesions or lacerations.in the horizontal and standing position she has no sense of pessary or sense of pessary movement. The patient should return in 3 months for a pessary check and continue to use vaginal estrogen cream weekly as prescribed.   By signing my name below, I, [employer], attest that this documentation has been prepared under the direction and in the presence of Tilda Burrow, MD. Electronically Signed: Mal Misty Medical Scribe. 03/09/20. 12:51 PM.  I personally performed the services described in this documentation, which was SCRIBED in my presence. The recorded information has been reviewed and considered accurate. It has been edited as necessary during review. Tilda Burrow, MD

## 2020-03-18 DIAGNOSIS — R69 Illness, unspecified: Secondary | ICD-10-CM | POA: Diagnosis not present

## 2020-03-18 DIAGNOSIS — I10 Essential (primary) hypertension: Secondary | ICD-10-CM | POA: Diagnosis not present

## 2020-03-18 DIAGNOSIS — M810 Age-related osteoporosis without current pathological fracture: Secondary | ICD-10-CM | POA: Diagnosis not present

## 2020-03-18 DIAGNOSIS — M130 Polyarthritis, unspecified: Secondary | ICD-10-CM | POA: Diagnosis not present

## 2020-03-23 DIAGNOSIS — I10 Essential (primary) hypertension: Secondary | ICD-10-CM | POA: Diagnosis not present

## 2020-03-23 DIAGNOSIS — M109 Gout, unspecified: Secondary | ICD-10-CM | POA: Diagnosis not present

## 2020-03-23 DIAGNOSIS — E1100 Type 2 diabetes mellitus with hyperosmolarity without nonketotic hyperglycemic-hyperosmolar coma (NKHHC): Secondary | ICD-10-CM | POA: Diagnosis not present

## 2020-03-23 DIAGNOSIS — M1991 Primary osteoarthritis, unspecified site: Secondary | ICD-10-CM | POA: Diagnosis not present

## 2020-04-01 DIAGNOSIS — Z0100 Encounter for examination of eyes and vision without abnormal findings: Secondary | ICD-10-CM | POA: Diagnosis not present

## 2020-04-13 DIAGNOSIS — K219 Gastro-esophageal reflux disease without esophagitis: Secondary | ICD-10-CM | POA: Diagnosis not present

## 2020-04-13 DIAGNOSIS — K769 Liver disease, unspecified: Secondary | ICD-10-CM | POA: Diagnosis not present

## 2020-04-13 DIAGNOSIS — I10 Essential (primary) hypertension: Secondary | ICD-10-CM | POA: Diagnosis not present

## 2020-04-13 DIAGNOSIS — J309 Allergic rhinitis, unspecified: Secondary | ICD-10-CM | POA: Diagnosis not present

## 2020-04-13 DIAGNOSIS — E119 Type 2 diabetes mellitus without complications: Secondary | ICD-10-CM | POA: Diagnosis not present

## 2020-04-13 DIAGNOSIS — M1990 Unspecified osteoarthritis, unspecified site: Secondary | ICD-10-CM | POA: Diagnosis not present

## 2020-04-13 DIAGNOSIS — R32 Unspecified urinary incontinence: Secondary | ICD-10-CM | POA: Diagnosis not present

## 2020-04-13 DIAGNOSIS — M810 Age-related osteoporosis without current pathological fracture: Secondary | ICD-10-CM | POA: Diagnosis not present

## 2020-04-13 DIAGNOSIS — M109 Gout, unspecified: Secondary | ICD-10-CM | POA: Diagnosis not present

## 2020-04-13 DIAGNOSIS — G8929 Other chronic pain: Secondary | ICD-10-CM | POA: Diagnosis not present

## 2020-04-20 DIAGNOSIS — K219 Gastro-esophageal reflux disease without esophagitis: Secondary | ICD-10-CM | POA: Diagnosis not present

## 2020-04-20 DIAGNOSIS — Z800 Family history of malignant neoplasm of digestive organs: Secondary | ICD-10-CM | POA: Diagnosis not present

## 2020-04-20 DIAGNOSIS — K5730 Diverticulosis of large intestine without perforation or abscess without bleeding: Secondary | ICD-10-CM | POA: Diagnosis not present

## 2020-04-20 DIAGNOSIS — K743 Primary biliary cirrhosis: Secondary | ICD-10-CM | POA: Diagnosis not present

## 2020-04-22 DIAGNOSIS — M1991 Primary osteoarthritis, unspecified site: Secondary | ICD-10-CM | POA: Diagnosis not present

## 2020-04-22 DIAGNOSIS — I10 Essential (primary) hypertension: Secondary | ICD-10-CM | POA: Diagnosis not present

## 2020-04-22 DIAGNOSIS — M109 Gout, unspecified: Secondary | ICD-10-CM | POA: Diagnosis not present

## 2020-04-22 DIAGNOSIS — E1165 Type 2 diabetes mellitus with hyperglycemia: Secondary | ICD-10-CM | POA: Diagnosis not present

## 2020-05-04 DIAGNOSIS — M79672 Pain in left foot: Secondary | ICD-10-CM | POA: Diagnosis not present

## 2020-05-04 DIAGNOSIS — M79671 Pain in right foot: Secondary | ICD-10-CM | POA: Diagnosis not present

## 2020-05-04 DIAGNOSIS — M2012 Hallux valgus (acquired), left foot: Secondary | ICD-10-CM | POA: Diagnosis not present

## 2020-05-04 DIAGNOSIS — M2011 Hallux valgus (acquired), right foot: Secondary | ICD-10-CM | POA: Diagnosis not present

## 2020-05-04 DIAGNOSIS — M109 Gout, unspecified: Secondary | ICD-10-CM | POA: Diagnosis not present

## 2020-05-04 DIAGNOSIS — M19071 Primary osteoarthritis, right ankle and foot: Secondary | ICD-10-CM | POA: Diagnosis not present

## 2020-05-12 DIAGNOSIS — R69 Illness, unspecified: Secondary | ICD-10-CM | POA: Diagnosis not present

## 2020-05-20 ENCOUNTER — Ambulatory Visit: Payer: Medicare HMO | Attending: Internal Medicine

## 2020-05-20 DIAGNOSIS — Z23 Encounter for immunization: Secondary | ICD-10-CM

## 2020-05-20 NOTE — Progress Notes (Signed)
   Covid-19 Vaccination Clinic  Name:  Leslie Berry    MRN: [id number] DOB: 1940/07/30  05/20/2020  Leslie Berry was observed post Covid-19 immunization for 15 minutes without incident. She was provided with Vaccine Information Sheet and instruction to access the V-Safe system.   Leslie Berry was instructed to call 911 with any severe reactions post vaccine: Marland Kitchen Difficulty breathing  . Swelling of face and throat  . A fast heartbeat  . A bad rash all over body  . Dizziness and weakness

## 2020-06-01 DIAGNOSIS — M19071 Primary osteoarthritis, right ankle and foot: Secondary | ICD-10-CM | POA: Diagnosis not present

## 2020-06-01 DIAGNOSIS — M2012 Hallux valgus (acquired), left foot: Secondary | ICD-10-CM | POA: Diagnosis not present

## 2020-06-01 DIAGNOSIS — M2011 Hallux valgus (acquired), right foot: Secondary | ICD-10-CM | POA: Diagnosis not present

## 2020-06-01 DIAGNOSIS — M109 Gout, unspecified: Secondary | ICD-10-CM | POA: Diagnosis not present

## 2020-06-22 DIAGNOSIS — M109 Gout, unspecified: Secondary | ICD-10-CM | POA: Diagnosis not present

## 2020-06-22 DIAGNOSIS — E1100 Type 2 diabetes mellitus with hyperosmolarity without nonketotic hyperglycemic-hyperosmolar coma (NKHHC): Secondary | ICD-10-CM | POA: Diagnosis not present

## 2020-06-22 DIAGNOSIS — I10 Essential (primary) hypertension: Secondary | ICD-10-CM | POA: Diagnosis not present

## 2020-06-22 DIAGNOSIS — M1991 Primary osteoarthritis, unspecified site: Secondary | ICD-10-CM | POA: Diagnosis not present

## 2020-07-01 DIAGNOSIS — Z961 Presence of intraocular lens: Secondary | ICD-10-CM | POA: Diagnosis not present

## 2020-07-29 DIAGNOSIS — E1169 Type 2 diabetes mellitus with other specified complication: Secondary | ICD-10-CM | POA: Diagnosis not present

## 2020-07-29 DIAGNOSIS — M109 Gout, unspecified: Secondary | ICD-10-CM | POA: Diagnosis not present

## 2020-07-29 DIAGNOSIS — M1991 Primary osteoarthritis, unspecified site: Secondary | ICD-10-CM | POA: Diagnosis not present

## 2020-07-29 DIAGNOSIS — I10 Essential (primary) hypertension: Secondary | ICD-10-CM | POA: Diagnosis not present

## 2020-08-17 DIAGNOSIS — M130 Polyarthritis, unspecified: Secondary | ICD-10-CM | POA: Diagnosis not present

## 2020-08-17 DIAGNOSIS — M1000 Idiopathic gout, unspecified site: Secondary | ICD-10-CM | POA: Diagnosis not present

## 2020-08-23 DIAGNOSIS — E1100 Type 2 diabetes mellitus with hyperosmolarity without nonketotic hyperglycemic-hyperosmolar coma (NKHHC): Secondary | ICD-10-CM | POA: Diagnosis not present

## 2020-08-23 DIAGNOSIS — M1991 Primary osteoarthritis, unspecified site: Secondary | ICD-10-CM | POA: Diagnosis not present

## 2020-08-23 DIAGNOSIS — I10 Essential (primary) hypertension: Secondary | ICD-10-CM | POA: Diagnosis not present

## 2020-08-23 DIAGNOSIS — M109 Gout, unspecified: Secondary | ICD-10-CM | POA: Diagnosis not present

## 2020-09-17 DIAGNOSIS — E210 Primary hyperparathyroidism: Secondary | ICD-10-CM | POA: Diagnosis not present

## 2020-09-17 DIAGNOSIS — E038 Other specified hypothyroidism: Secondary | ICD-10-CM | POA: Diagnosis not present

## 2020-09-17 DIAGNOSIS — E1100 Type 2 diabetes mellitus with hyperosmolarity without nonketotic hyperglycemic-hyperosmolar coma (NKHHC): Secondary | ICD-10-CM | POA: Diagnosis not present

## 2020-09-17 DIAGNOSIS — M109 Gout, unspecified: Secondary | ICD-10-CM | POA: Diagnosis not present

## 2020-09-17 DIAGNOSIS — E782 Mixed hyperlipidemia: Secondary | ICD-10-CM | POA: Diagnosis not present

## 2020-09-17 DIAGNOSIS — E1169 Type 2 diabetes mellitus with other specified complication: Secondary | ICD-10-CM | POA: Diagnosis not present

## 2020-09-17 DIAGNOSIS — H900 Conductive hearing loss, bilateral: Secondary | ICD-10-CM | POA: Diagnosis not present

## 2020-09-17 DIAGNOSIS — I10 Essential (primary) hypertension: Secondary | ICD-10-CM | POA: Diagnosis not present

## 2020-09-20 DIAGNOSIS — M109 Gout, unspecified: Secondary | ICD-10-CM | POA: Diagnosis not present

## 2020-09-20 DIAGNOSIS — E1100 Type 2 diabetes mellitus with hyperosmolarity without nonketotic hyperglycemic-hyperosmolar coma (NKHHC): Secondary | ICD-10-CM | POA: Diagnosis not present

## 2020-09-20 DIAGNOSIS — I10 Essential (primary) hypertension: Secondary | ICD-10-CM | POA: Diagnosis not present

## 2020-09-20 DIAGNOSIS — M1991 Primary osteoarthritis, unspecified site: Secondary | ICD-10-CM | POA: Diagnosis not present

## 2020-09-28 ENCOUNTER — Other Ambulatory Visit (HOSPITAL_COMMUNITY): Payer: Self-pay | Admitting: Family Medicine

## 2020-09-28 DIAGNOSIS — Z1231 Encounter for screening mammogram for malignant neoplasm of breast: Secondary | ICD-10-CM

## 2020-10-25 DIAGNOSIS — M130 Polyarthritis, unspecified: Secondary | ICD-10-CM | POA: Diagnosis not present

## 2020-10-25 DIAGNOSIS — M1000 Idiopathic gout, unspecified site: Secondary | ICD-10-CM | POA: Diagnosis not present

## 2020-10-25 DIAGNOSIS — I10 Essential (primary) hypertension: Secondary | ICD-10-CM | POA: Diagnosis not present

## 2020-10-25 DIAGNOSIS — E210 Primary hyperparathyroidism: Secondary | ICD-10-CM | POA: Diagnosis not present

## 2020-10-25 DIAGNOSIS — E1169 Type 2 diabetes mellitus with other specified complication: Secondary | ICD-10-CM | POA: Diagnosis not present

## 2020-10-25 DIAGNOSIS — E782 Mixed hyperlipidemia: Secondary | ICD-10-CM | POA: Diagnosis not present

## 2020-11-08 ENCOUNTER — Ambulatory Visit (HOSPITAL_COMMUNITY)
Admission: RE | Admit: 2020-11-08 | Discharge: 2020-11-08 | Disposition: A | Discharge status: Home / Self Care | Payer: Medicare HMO | Source: Ambulatory Visit | Attending: Family Medicine | Admitting: Family Medicine

## 2020-11-08 DIAGNOSIS — Z1231 Encounter for screening mammogram for malignant neoplasm of breast: Secondary | ICD-10-CM | POA: Insufficient documentation

## 2020-11-17 ENCOUNTER — Ambulatory Visit (HOSPITAL_COMMUNITY): Payer: Medicare HMO | Attending: Family Medicine

## 2020-11-17 ENCOUNTER — Other Ambulatory Visit: Payer: Self-pay

## 2020-11-17 DIAGNOSIS — M542 Cervicalgia: Secondary | ICD-10-CM | POA: Insufficient documentation

## 2020-11-17 NOTE — Therapy (Signed)
[facility] [address] [place], [place], [address] Phone: [phone]   Fax:  [phone]  Physical Therapy Evaluation  Patient Details  Name: Leslie Berry MRN: [id number] Date of Birth: 25-Jan-1941 Referring Provider (PT): Renaye Rakers   Encounter Date: 11/17/2020   PT End of Session - 11/17/20 1304    Visit Number 1    Number of Visits 1    Authorization Type [employer] HMO, no auth, no VL    PT Start Time 1305    PT Stop Time 1335    PT Time Calculation (min) 30 min    Activity Tolerance Patient tolerated treatment well    Behavior During Therapy [facility] for tasks assessed/performed           Past Medical History:  Diagnosis Date  . Arthritis   . Diabetes mellitus   . Elevated LFTs   . GERD (gastroesophageal reflux disease)   . Gout   . Hypertension    dr bland  pcp    Past Surgical History:  Procedure Laterality Date  . CARPAL TUNNEL RELEASE    . CARPAL TUNNEL RELEASE  12/22/2011   Procedure: CARPAL TUNNEL RELEASE;  Surgeon: Kennieth Rad, MD;  Location: [facility] OR;  Service: Orthopedics;  Laterality: Right;  . CESAREAN SECTION    . ROTATOR CUFF REPAIR      There were no vitals filed for this visit.    Subjective Assessment - 11/17/20 1308    Subjective Onset of neck and left shoulder pain about two weeks ago and reports it is mainly gone now.    How long can you sit comfortably? no issues    How long can you stand comfortably? no issues    How long can you walk comfortably? no issues    Diagnostic tests none at this time    Patient Stated Goals "No problems, I feel much better"    Currently in Pain? Yes    Pain Score 0-No pain    Pain Location Neck    Pain Orientation Left;Posterior    Pain Descriptors / Indicators Aching    Pain Type Acute pain    Aggravating Factors  pt denies any issues at this time and reports only some slight stiffness in right side of neck    Effect of Pain on Daily Activities none               [facility] PT Assessment - 11/17/20 0001      Assessment   Medical Diagnosis neck and left shoulder pain    Referring Provider (PT) Renaye Rakers      Balance Screen   Has the patient fallen in the past 6 months No    Has the patient had a decrease in activity level because of a fear of falling?  No    Is the patient reluctant to leave their home because of a fear of falling?  No      Prior Function   Level of Independence Independent    Vocation Retired      Press photographer Comments good posture      ROM / Strength   AROM / PROM / Strength AROM;Strength      AROM   AROM Assessment Site Cervical    Cervical Flexion WNL    Cervical Extension WNL    Cervical - Right Side Bend WNL    Cervical - Left Side Bend WNL    Cervical -  Right Rotation WNL    Cervical - Left Rotation WNL      Strength   Strength Assessment Site Shoulder;Elbow    Right/Left Shoulder Right;Left    Right Shoulder Flexion 5/5    Right Shoulder Extension 5/5    Right Shoulder ABduction 5/5    Right Shoulder Internal Rotation 5/5    Right Shoulder External Rotation 5/5    Right Shoulder Horizontal ABduction 5/5    Right Shoulder Horizontal ADduction 5/5    Left Shoulder Flexion 5/5    Left Shoulder Extension 5/5    Left Shoulder ABduction 5/5    Left Shoulder Internal Rotation 5/5    Left Shoulder External Rotation 5/5    Left Shoulder Horizontal ABduction 5/5    Right/Left Elbow Right;Left    Right Elbow Flexion 5/5    Right Elbow Extension 5/5    Left Elbow Flexion 5/5    Left Elbow Extension 5/5      Palpation   Palpation comment no trigger points or hypo/hypermobility appreciated                      Objective measurements completed on examination: See above findings.               PT Education - 11/17/20 1327    Education Details Discussion with patient regarding current presentation and she and this therapist feel no further services  are needed at this time. Educated on performance of ROM and SNAG at home and encouraged to seek referral if symptoms return    Person(s) Educated Patient    Methods Explanation    Comprehension Verbalized understanding                       Plan - 11/17/20 1329    Clinical Impression Statement Patient presents with no deficits or limitations at this time and reports she feels 95% improved from the initial outset of her symptoms and notes a near full recovery since neck pain begain 2 weeks ago. Pt and therapist agree no further care indicated at this time and she feels confident in resuming her typical activities    Personal Factors and Comorbidities Age    Stability/Clinical Decision Making Stable/Uncomplicated    Clinical Decision Making Low    Rehab Potential Excellent    PT Frequency One time visit    PT Next Visit Plan N/A at this time    PT Home Exercise Plan Educated in cervical ROM and SNAG with towel for extension    Consulted and Agree with Plan of Care Patient           Patient will benefit from skilled therapeutic intervention in order to improve the following deficits and impairments:     Visit Diagnosis: Neck pain     Problem List Patient Active Problem List   Diagnosis Date Noted  . Senile atrophic vaginitis 10/03/2016  . Vaginal vault prolapse after hysterectomy 06/04/2014  . Left leg weakness 05/30/2011   1:33 PM, 11/17/20 M. Shary Decamp, PT, DPT Physical Therapist- [place] Office Number: [phone]  [facility] [address] [place], [place], [address] Phone: [phone]   Fax:  [phone]  Name: Leslie Berry MRN: [id number] Date of Birth: February 03, 1941

## 2020-11-26 DIAGNOSIS — M130 Polyarthritis, unspecified: Secondary | ICD-10-CM | POA: Diagnosis not present

## 2020-11-26 DIAGNOSIS — E038 Other specified hypothyroidism: Secondary | ICD-10-CM | POA: Diagnosis not present

## 2020-11-26 DIAGNOSIS — I10 Essential (primary) hypertension: Secondary | ICD-10-CM | POA: Diagnosis not present

## 2020-11-26 DIAGNOSIS — I739 Peripheral vascular disease, unspecified: Secondary | ICD-10-CM | POA: Diagnosis not present

## 2020-11-26 DIAGNOSIS — E1169 Type 2 diabetes mellitus with other specified complication: Secondary | ICD-10-CM | POA: Diagnosis not present

## 2021-01-20 DIAGNOSIS — I10 Essential (primary) hypertension: Secondary | ICD-10-CM | POA: Diagnosis not present

## 2021-01-20 DIAGNOSIS — E1100 Type 2 diabetes mellitus with hyperosmolarity without nonketotic hyperglycemic-hyperosmolar coma (NKHHC): Secondary | ICD-10-CM | POA: Diagnosis not present

## 2021-01-20 DIAGNOSIS — M1991 Primary osteoarthritis, unspecified site: Secondary | ICD-10-CM | POA: Diagnosis not present

## 2021-01-28 DIAGNOSIS — I10 Essential (primary) hypertension: Secondary | ICD-10-CM | POA: Diagnosis not present

## 2021-01-28 DIAGNOSIS — I499 Cardiac arrhythmia, unspecified: Secondary | ICD-10-CM | POA: Diagnosis not present

## 2021-01-28 DIAGNOSIS — M1000 Idiopathic gout, unspecified site: Secondary | ICD-10-CM | POA: Diagnosis not present

## 2021-01-28 DIAGNOSIS — R143 Flatulence: Secondary | ICD-10-CM | POA: Diagnosis not present

## 2021-01-28 DIAGNOSIS — M130 Polyarthritis, unspecified: Secondary | ICD-10-CM | POA: Diagnosis not present

## 2021-01-28 DIAGNOSIS — E118 Type 2 diabetes mellitus with unspecified complications: Secondary | ICD-10-CM | POA: Diagnosis not present

## 2021-02-04 ENCOUNTER — Telehealth: Payer: Self-pay | Admitting: *Deleted

## 2021-02-04 DIAGNOSIS — I499 Cardiac arrhythmia, unspecified: Secondary | ICD-10-CM

## 2021-02-04 NOTE — Telephone Encounter (Signed)
Received a faxed order from [facility] for a 30 day monitor for cardiac arrhythmia. Pt enrolled in [employer]. Tried to call pt and notify. No answer, busy signal.

## 2021-02-11 DIAGNOSIS — I4589 Other specified conduction disorders: Secondary | ICD-10-CM | POA: Diagnosis not present

## 2021-02-11 DIAGNOSIS — N399 Disorder of urinary system, unspecified: Secondary | ICD-10-CM | POA: Diagnosis not present

## 2021-02-11 DIAGNOSIS — N898 Other specified noninflammatory disorders of vagina: Secondary | ICD-10-CM | POA: Diagnosis not present

## 2021-02-11 DIAGNOSIS — N390 Urinary tract infection, site not specified: Secondary | ICD-10-CM | POA: Diagnosis not present

## 2021-02-17 ENCOUNTER — Encounter: Payer: Self-pay | Admitting: Obstetrics & Gynecology

## 2021-02-17 ENCOUNTER — Other Ambulatory Visit: Payer: Self-pay

## 2021-02-17 ENCOUNTER — Other Ambulatory Visit (HOSPITAL_COMMUNITY)
Admission: RE | Admit: 2021-02-17 | Discharge: 2021-02-17 | Disposition: A | Discharge status: Home / Self Care | Payer: Medicare HMO | Source: Ambulatory Visit | Attending: Obstetrics & Gynecology | Admitting: Obstetrics & Gynecology

## 2021-02-17 ENCOUNTER — Ambulatory Visit: Payer: Medicare HMO | Admitting: Obstetrics & Gynecology

## 2021-02-17 VITALS — BP 155/97 | HR 59 | Ht 65.0 in | Wt 157.6 lb

## 2021-02-17 DIAGNOSIS — T8389XA Other specified complication of genitourinary prosthetic devices, implants and grafts, initial encounter: Secondary | ICD-10-CM | POA: Diagnosis not present

## 2021-02-17 DIAGNOSIS — B373 Candidiasis of vulva and vagina: Secondary | ICD-10-CM | POA: Insufficient documentation

## 2021-02-17 DIAGNOSIS — N898 Other specified noninflammatory disorders of vagina: Secondary | ICD-10-CM

## 2021-02-17 DIAGNOSIS — T83511A Infection and inflammatory reaction due to indwelling urethral catheter, initial encounter: Secondary | ICD-10-CM | POA: Insufficient documentation

## 2021-02-17 DIAGNOSIS — Z87440 Personal history of urinary (tract) infections: Secondary | ICD-10-CM | POA: Diagnosis not present

## 2021-02-17 DIAGNOSIS — B9620 Unspecified Escherichia coli [E. coli] as the cause of diseases classified elsewhere: Secondary | ICD-10-CM | POA: Diagnosis not present

## 2021-02-17 DIAGNOSIS — Z118 Encounter for screening for other infectious and parasitic diseases: Secondary | ICD-10-CM | POA: Diagnosis present

## 2021-02-17 NOTE — Progress Notes (Signed)
     Chief Complaint  Patient presents with   issue pessary   possible infection   Leslie Berry presents today for routine follow up related to her pessary.  Last seen by Dr. Emelda Fear 02/2020.  She uses a Gelhorn 2.25  Pt does replace pessary on occasion when it seems to fall out- typically every 2 weeks.  She notes irritation on the outside, denies discharge.  Slight odor. NO vaginal bleeding.  Pt states that last week she was seen by PCP- given ABX and cream due to UTI.  Likert scale(1 not bothersome -5 very bothersome)  :  2   Exam: Blood pressure (!) 155/97, pulse (!) 59, height 5\' 5"  (1.651 m), weight 157 lb 9.6 oz (71.5 kg).  Exam reveals no undue vaginal mucosal pressure of breakdown, no discharge and no vaginal bleeding.  Vaginal Epithelial Abnormality Classification System:   0 0    No abnormalities 1    Epithelial erythema 2    Granulation tissue 3    Epithelial break or erosion, 1 cm or less 4    Epithelial break or erosion, 1 cm or greater  The pessary is removed, cleaned and replaced without difficulty.      ICD-10-CM   1. Vaginal irritation from pessary (HCC)  T83.89XA    N89.8     2. History of urinary tract infection  Z87.440 Urine Culture       -UA/Culture sent due to recent infection- further management pending results -vaginitis panel also completed to r/o underlying infection -Pessary replaced without difficulty, since she cleans on her own, ok to f/u q 62mos   5mo, DO  02/17/2021 4:29 PM

## 2021-02-20 LAB — URINE CULTURE

## 2021-02-21 ENCOUNTER — Telehealth: Payer: Self-pay

## 2021-02-21 ENCOUNTER — Other Ambulatory Visit: Payer: Self-pay | Admitting: Obstetrics & Gynecology

## 2021-02-21 LAB — CERVICOVAGINAL ANCILLARY ONLY
Bacterial Vaginitis (gardnerella): NEGATIVE
Candida Glabrata: NEGATIVE
Candida Vaginitis: POSITIVE — AB
Comment: NEGATIVE
Comment: NEGATIVE
Comment: NEGATIVE

## 2021-02-21 MED ORDER — NITROFURANTOIN MONOHYD MACRO 100 MG PO CAPS: 100.0000 mg | ORAL_CAPSULE | Freq: Two times a day (BID) | ORAL | 0 refills | Status: AC

## 2021-02-21 NOTE — Telephone Encounter (Signed)
Called pt to inform her of UTI and prescription sent. No answer, no vm available.

## 2021-02-22 ENCOUNTER — Telehealth: Payer: Self-pay

## 2021-02-22 ENCOUNTER — Other Ambulatory Visit: Payer: Self-pay | Admitting: Obstetrics & Gynecology

## 2021-02-22 MED ORDER — TERCONAZOLE 0.4 % VA CREA: 1.0000 | TOPICAL_CREAM | Freq: Every day | VAGINAL | 0 refills | Status: AC

## 2021-02-22 NOTE — Telephone Encounter (Signed)
Called pt to inform her of test results. She had already picked up her antibiotic for her UTI yesterday. She will pu the cream and take as directed. Pt confirmed understanding.

## 2021-03-07 DIAGNOSIS — H5711 Ocular pain, right eye: Secondary | ICD-10-CM | POA: Diagnosis not present

## 2021-03-16 DIAGNOSIS — N390 Urinary tract infection, site not specified: Secondary | ICD-10-CM | POA: Diagnosis not present

## 2021-03-16 NOTE — Telephone Encounter (Signed)
Received a call from the [facility] stating that the pt did now receive the event monitor. Pt re-enrolled in [employer] and pt called to explain that they would be calling her. Pt voiced understanding.

## 2021-03-23 ENCOUNTER — Ambulatory Visit (INDEPENDENT_AMBULATORY_CARE_PROVIDER_SITE_OTHER): Payer: Medicare HMO

## 2021-03-23 DIAGNOSIS — I499 Cardiac arrhythmia, unspecified: Secondary | ICD-10-CM

## 2021-03-23 DIAGNOSIS — I10 Essential (primary) hypertension: Secondary | ICD-10-CM | POA: Diagnosis not present

## 2021-03-23 DIAGNOSIS — M1991 Primary osteoarthritis, unspecified site: Secondary | ICD-10-CM | POA: Diagnosis not present

## 2021-03-23 DIAGNOSIS — E1100 Type 2 diabetes mellitus with hyperosmolarity without nonketotic hyperglycemic-hyperosmolar coma (NKHHC): Secondary | ICD-10-CM | POA: Diagnosis not present

## 2021-03-24 ENCOUNTER — Other Ambulatory Visit: Payer: Self-pay

## 2021-03-25 DIAGNOSIS — N390 Urinary tract infection, site not specified: Secondary | ICD-10-CM | POA: Diagnosis not present

## 2021-03-25 DIAGNOSIS — Z811 Family history of alcohol abuse and dependence: Secondary | ICD-10-CM | POA: Diagnosis not present

## 2021-03-25 DIAGNOSIS — I10 Essential (primary) hypertension: Secondary | ICD-10-CM | POA: Diagnosis not present

## 2021-03-25 DIAGNOSIS — E119 Type 2 diabetes mellitus without complications: Secondary | ICD-10-CM | POA: Diagnosis not present

## 2021-03-25 DIAGNOSIS — I499 Cardiac arrhythmia, unspecified: Secondary | ICD-10-CM | POA: Diagnosis not present

## 2021-03-25 DIAGNOSIS — Z7983 Long term (current) use of bisphosphonates: Secondary | ICD-10-CM | POA: Diagnosis not present

## 2021-03-25 DIAGNOSIS — M109 Gout, unspecified: Secondary | ICD-10-CM | POA: Diagnosis not present

## 2021-03-25 DIAGNOSIS — Z803 Family history of malignant neoplasm of breast: Secondary | ICD-10-CM | POA: Diagnosis not present

## 2021-03-25 DIAGNOSIS — Z7982 Long term (current) use of aspirin: Secondary | ICD-10-CM | POA: Diagnosis not present

## 2021-03-25 DIAGNOSIS — K219 Gastro-esophageal reflux disease without esophagitis: Secondary | ICD-10-CM | POA: Diagnosis not present

## 2021-03-25 DIAGNOSIS — Z7984 Long term (current) use of oral hypoglycemic drugs: Secondary | ICD-10-CM | POA: Diagnosis not present

## 2021-03-25 DIAGNOSIS — M810 Age-related osteoporosis without current pathological fracture: Secondary | ICD-10-CM | POA: Diagnosis not present

## 2021-04-01 DIAGNOSIS — N390 Urinary tract infection, site not specified: Secondary | ICD-10-CM | POA: Diagnosis not present

## 2021-04-04 DIAGNOSIS — N399 Disorder of urinary system, unspecified: Secondary | ICD-10-CM | POA: Diagnosis not present

## 2021-04-14 DIAGNOSIS — M109 Gout, unspecified: Secondary | ICD-10-CM | POA: Diagnosis not present

## 2021-04-22 DIAGNOSIS — I10 Essential (primary) hypertension: Secondary | ICD-10-CM | POA: Diagnosis not present

## 2021-04-22 DIAGNOSIS — M1991 Primary osteoarthritis, unspecified site: Secondary | ICD-10-CM | POA: Diagnosis not present

## 2021-04-22 DIAGNOSIS — E1100 Type 2 diabetes mellitus with hyperosmolarity without nonketotic hyperglycemic-hyperosmolar coma (NKHHC): Secondary | ICD-10-CM | POA: Diagnosis not present

## 2021-04-26 DIAGNOSIS — M109 Gout, unspecified: Secondary | ICD-10-CM | POA: Diagnosis not present

## 2021-04-27 DIAGNOSIS — M109 Gout, unspecified: Secondary | ICD-10-CM | POA: Diagnosis not present

## 2021-05-02 ENCOUNTER — Telehealth: Payer: Self-pay

## 2021-05-02 DIAGNOSIS — I10 Essential (primary) hypertension: Secondary | ICD-10-CM | POA: Diagnosis not present

## 2021-05-02 NOTE — Telephone Encounter (Signed)
Monitor downloaded, will forward to Dr.Branch (was DOD at the time of request) to read.

## 2021-05-02 NOTE — Telephone Encounter (Signed)
[facility] clinic is requesting monitor results, they have patient in office today

## 2021-05-04 NOTE — Telephone Encounter (Signed)
Monitor report sent to Dr.Bland.

## 2021-05-22 DIAGNOSIS — I10 Essential (primary) hypertension: Secondary | ICD-10-CM | POA: Diagnosis not present

## 2021-05-22 DIAGNOSIS — E1100 Type 2 diabetes mellitus with hyperosmolarity without nonketotic hyperglycemic-hyperosmolar coma (NKHHC): Secondary | ICD-10-CM | POA: Diagnosis not present

## 2021-05-22 DIAGNOSIS — M1991 Primary osteoarthritis, unspecified site: Secondary | ICD-10-CM | POA: Diagnosis not present

## 2021-05-23 DIAGNOSIS — I10 Essential (primary) hypertension: Secondary | ICD-10-CM | POA: Diagnosis not present

## 2021-05-23 DIAGNOSIS — I739 Peripheral vascular disease, unspecified: Secondary | ICD-10-CM | POA: Diagnosis not present

## 2021-06-30 DIAGNOSIS — E119 Type 2 diabetes mellitus without complications: Secondary | ICD-10-CM | POA: Diagnosis not present

## 2021-06-30 DIAGNOSIS — Z961 Presence of intraocular lens: Secondary | ICD-10-CM | POA: Diagnosis not present

## 2021-06-30 DIAGNOSIS — H524 Presbyopia: Secondary | ICD-10-CM | POA: Diagnosis not present

## 2021-06-30 DIAGNOSIS — Z7984 Long term (current) use of oral hypoglycemic drugs: Secondary | ICD-10-CM | POA: Diagnosis not present

## 2021-07-01 DIAGNOSIS — R69 Illness, unspecified: Secondary | ICD-10-CM | POA: Diagnosis not present

## 2021-07-01 DIAGNOSIS — I10 Essential (primary) hypertension: Secondary | ICD-10-CM | POA: Diagnosis not present

## 2021-07-01 DIAGNOSIS — E1169 Type 2 diabetes mellitus with other specified complication: Secondary | ICD-10-CM | POA: Diagnosis not present

## 2021-07-01 DIAGNOSIS — F064 Anxiety disorder due to known physiological condition: Secondary | ICD-10-CM | POA: Diagnosis not present

## 2021-07-01 DIAGNOSIS — E038 Other specified hypothyroidism: Secondary | ICD-10-CM | POA: Diagnosis not present

## 2021-07-01 DIAGNOSIS — E119 Type 2 diabetes mellitus without complications: Secondary | ICD-10-CM | POA: Diagnosis not present

## 2021-07-01 DIAGNOSIS — E782 Mixed hyperlipidemia: Secondary | ICD-10-CM | POA: Diagnosis not present

## 2021-07-01 DIAGNOSIS — H900 Conductive hearing loss, bilateral: Secondary | ICD-10-CM | POA: Diagnosis not present

## 2021-07-01 DIAGNOSIS — M109 Gout, unspecified: Secondary | ICD-10-CM | POA: Diagnosis not present

## 2021-07-31 DIAGNOSIS — Z20822 Contact with and (suspected) exposure to covid-19: Secondary | ICD-10-CM | POA: Diagnosis not present

## 2021-08-04 ENCOUNTER — Ambulatory Visit: Payer: Medicare HMO | Admitting: Obstetrics & Gynecology

## 2021-08-09 DIAGNOSIS — I10 Essential (primary) hypertension: Secondary | ICD-10-CM | POA: Diagnosis not present

## 2021-08-09 DIAGNOSIS — M109 Gout, unspecified: Secondary | ICD-10-CM | POA: Diagnosis not present

## 2021-08-09 DIAGNOSIS — E038 Other specified hypothyroidism: Secondary | ICD-10-CM | POA: Diagnosis not present

## 2021-08-09 DIAGNOSIS — Z20822 Contact with and (suspected) exposure to covid-19: Secondary | ICD-10-CM | POA: Diagnosis not present

## 2021-08-09 DIAGNOSIS — E1169 Type 2 diabetes mellitus with other specified complication: Secondary | ICD-10-CM | POA: Diagnosis not present

## 2021-08-09 DIAGNOSIS — R69 Illness, unspecified: Secondary | ICD-10-CM | POA: Diagnosis not present

## 2021-08-11 ENCOUNTER — Other Ambulatory Visit (HOSPITAL_COMMUNITY): Payer: Self-pay | Admitting: Internal Medicine

## 2021-08-11 ENCOUNTER — Other Ambulatory Visit (HOSPITAL_COMMUNITY): Payer: Self-pay | Admitting: Family Medicine

## 2021-08-11 DIAGNOSIS — M810 Age-related osteoporosis without current pathological fracture: Secondary | ICD-10-CM

## 2021-08-11 DIAGNOSIS — Z0189 Encounter for other specified special examinations: Secondary | ICD-10-CM | POA: Diagnosis not present

## 2021-08-11 DIAGNOSIS — I10 Essential (primary) hypertension: Secondary | ICD-10-CM | POA: Diagnosis not present

## 2021-08-11 DIAGNOSIS — E119 Type 2 diabetes mellitus without complications: Secondary | ICD-10-CM | POA: Diagnosis not present

## 2021-08-11 DIAGNOSIS — Z1231 Encounter for screening mammogram for malignant neoplasm of breast: Secondary | ICD-10-CM

## 2021-08-11 DIAGNOSIS — K219 Gastro-esophageal reflux disease without esophagitis: Secondary | ICD-10-CM | POA: Diagnosis not present

## 2021-08-11 DIAGNOSIS — K7460 Unspecified cirrhosis of liver: Secondary | ICD-10-CM | POA: Diagnosis not present

## 2021-08-11 DIAGNOSIS — M109 Gout, unspecified: Secondary | ICD-10-CM | POA: Diagnosis not present

## 2021-08-11 DIAGNOSIS — N3281 Overactive bladder: Secondary | ICD-10-CM | POA: Diagnosis not present

## 2021-08-12 ENCOUNTER — Ambulatory Visit: Payer: Medicare HMO | Admitting: Obstetrics & Gynecology

## 2021-08-12 ENCOUNTER — Other Ambulatory Visit: Payer: Self-pay

## 2021-08-12 ENCOUNTER — Encounter: Payer: Self-pay | Admitting: Obstetrics & Gynecology

## 2021-08-12 VITALS — BP 125/80 | HR 54 | Ht 65.0 in | Wt 156.0 lb

## 2021-08-12 DIAGNOSIS — N993 Prolapse of vaginal vault after hysterectomy: Secondary | ICD-10-CM | POA: Diagnosis not present

## 2021-08-12 MED ORDER — HYDROCORT-PRAMOXINE (PERIANAL) 1-1 % EX FOAM: 1.0000 | Freq: Two times a day (BID) | CUTANEOUS | 3 refills | Status: DC

## 2021-08-12 NOTE — Progress Notes (Signed)
Chief Complaint  Patient presents with   Pessary Check    Blood pressure 125/80, pulse (!) 54, height 5\' 5"  (1.651 m), weight 156 lb (70.8 kg).  presents today for routine follow up related to her pessary.   She uses a Gelhorn 2.25 inch She reports no vaginal discharge and no vaginal bleeding   Likert scale(1 not bothersome -5 very bothersome)  :  1  Exam reveals no undue vaginal mucosal pressure of breakdown, no discharge and no vaginal bleeding.  Vaginal Epithelial Abnormality Classification System:   0 0    No abnormalities 1    Epithelial erythema 2    Granulation tissue 3    Epithelial break or erosion, 1 cm or less 4    Epithelial break or erosion, 1 cm or greater  The pessary is removed, cleaned and replaced without difficulty.    No diagnosis found.   Dow Adolph will be sen back in 4 months for continued follow up.  Dow Adolph, MD  08/12/2021 11:20 AM

## 2021-08-15 ENCOUNTER — Other Ambulatory Visit (HOSPITAL_COMMUNITY): Payer: Medicare HMO

## 2021-08-20 ENCOUNTER — Other Ambulatory Visit: Payer: Self-pay | Admitting: Obstetrics & Gynecology

## 2021-08-25 ENCOUNTER — Other Ambulatory Visit: Payer: Self-pay | Admitting: Obstetrics & Gynecology

## 2021-08-25 ENCOUNTER — Telehealth: Payer: Self-pay | Admitting: Obstetrics & Gynecology

## 2021-08-25 MED ORDER — TERCONAZOLE 0.4 % VA CREA: 1.0000 | TOPICAL_CREAM | Freq: Every day | VAGINAL | 0 refills | Status: DC

## 2021-08-25 NOTE — Telephone Encounter (Signed)
Patient called stating that she has an itch on her vagina, patient states that she has used all over the counter things but nothing is working. Patient would like to know if Dr. Despina Hidden could call her in something. Patient uses the [facility] in [place]. Please contact pt

## 2021-08-25 NOTE — Telephone Encounter (Signed)
Pt informed that rx was sent to the pharmacy

## 2021-08-25 NOTE — Telephone Encounter (Signed)
Terazol e prescribed

## 2021-08-31 ENCOUNTER — Telehealth: Payer: Self-pay | Admitting: Obstetrics & Gynecology

## 2021-08-31 NOTE — Telephone Encounter (Signed)
Patient called stating that her insurance doesn't cover the hydrocortisone-pramoxine rectal foam and would like something cheaper called in. Please advise.

## 2021-09-05 ENCOUNTER — Telehealth: Payer: Self-pay

## 2021-09-05 ENCOUNTER — Other Ambulatory Visit: Payer: Self-pay | Admitting: Obstetrics & Gynecology

## 2021-09-05 MED ORDER — FLUCONAZOLE 100 MG PO TABS: 100.0000 mg | ORAL_TABLET | Freq: Every day | ORAL | 0 refills | Status: DC

## 2021-09-05 NOTE — Addendum Note (Signed)
Addended by: Janece Canterbury on: 09/05/2021 04:01 PM   Modules accepted: Orders

## 2021-09-05 NOTE — Telephone Encounter (Signed)
Pt called and stated the last time she was here, a prescription was called in but her insurance would not cover it.  Pt wants a different medicine called in.

## 2021-09-05 NOTE — Telephone Encounter (Signed)
Pt was mistaken it was actually the proctofoam that her insurance didn't cover. I cancelled the diflucan with her pharmacy.

## 2021-09-05 NOTE — Telephone Encounter (Signed)
If it won't cover that one, then I don't know what it will cover, she can refer to her insurance book or call her company to find out what it would cover, otherwise, use OTC steroid cream or foam

## 2021-09-05 NOTE — Telephone Encounter (Signed)
Pt informed

## 2021-09-08 DIAGNOSIS — R002 Palpitations: Secondary | ICD-10-CM | POA: Diagnosis not present

## 2021-09-08 DIAGNOSIS — M109 Gout, unspecified: Secondary | ICD-10-CM | POA: Diagnosis not present

## 2021-09-08 DIAGNOSIS — K7460 Unspecified cirrhosis of liver: Secondary | ICD-10-CM | POA: Diagnosis not present

## 2021-09-08 DIAGNOSIS — E119 Type 2 diabetes mellitus without complications: Secondary | ICD-10-CM | POA: Diagnosis not present

## 2021-09-08 DIAGNOSIS — I10 Essential (primary) hypertension: Secondary | ICD-10-CM | POA: Diagnosis not present

## 2021-09-08 DIAGNOSIS — M810 Age-related osteoporosis without current pathological fracture: Secondary | ICD-10-CM | POA: Diagnosis not present

## 2021-09-08 DIAGNOSIS — K219 Gastro-esophageal reflux disease without esophagitis: Secondary | ICD-10-CM | POA: Diagnosis not present

## 2021-09-08 DIAGNOSIS — E1165 Type 2 diabetes mellitus with hyperglycemia: Secondary | ICD-10-CM | POA: Diagnosis not present

## 2021-09-08 DIAGNOSIS — N3281 Overactive bladder: Secondary | ICD-10-CM | POA: Diagnosis not present

## 2021-09-09 NOTE — Telephone Encounter (Signed)
I got the message some other way I guess cause I already took care of it at the beginning of the week  Thanks

## 2021-09-12 DIAGNOSIS — R002 Palpitations: Secondary | ICD-10-CM | POA: Diagnosis not present

## 2021-09-15 ENCOUNTER — Ambulatory Visit: Payer: Medicare HMO | Admitting: Nurse Practitioner

## 2021-09-15 DIAGNOSIS — Z6825 Body mass index (BMI) 25.0-25.9, adult: Secondary | ICD-10-CM | POA: Diagnosis not present

## 2021-09-15 DIAGNOSIS — M810 Age-related osteoporosis without current pathological fracture: Secondary | ICD-10-CM | POA: Diagnosis not present

## 2021-09-15 DIAGNOSIS — I10 Essential (primary) hypertension: Secondary | ICD-10-CM | POA: Diagnosis not present

## 2021-09-15 DIAGNOSIS — E119 Type 2 diabetes mellitus without complications: Secondary | ICD-10-CM | POA: Diagnosis not present

## 2021-09-15 DIAGNOSIS — N3281 Overactive bladder: Secondary | ICD-10-CM | POA: Diagnosis not present

## 2021-09-15 DIAGNOSIS — K7460 Unspecified cirrhosis of liver: Secondary | ICD-10-CM | POA: Diagnosis not present

## 2021-09-15 DIAGNOSIS — M109 Gout, unspecified: Secondary | ICD-10-CM | POA: Diagnosis not present

## 2021-09-15 DIAGNOSIS — R002 Palpitations: Secondary | ICD-10-CM | POA: Diagnosis not present

## 2021-09-15 DIAGNOSIS — K219 Gastro-esophageal reflux disease without esophagitis: Secondary | ICD-10-CM | POA: Diagnosis not present

## 2021-09-20 DIAGNOSIS — Z7984 Long term (current) use of oral hypoglycemic drugs: Secondary | ICD-10-CM | POA: Diagnosis not present

## 2021-09-20 DIAGNOSIS — I10 Essential (primary) hypertension: Secondary | ICD-10-CM | POA: Diagnosis not present

## 2021-09-20 DIAGNOSIS — R32 Unspecified urinary incontinence: Secondary | ICD-10-CM | POA: Diagnosis not present

## 2021-09-20 DIAGNOSIS — M810 Age-related osteoporosis without current pathological fracture: Secondary | ICD-10-CM | POA: Diagnosis not present

## 2021-09-20 DIAGNOSIS — M109 Gout, unspecified: Secondary | ICD-10-CM | POA: Diagnosis not present

## 2021-09-20 DIAGNOSIS — E119 Type 2 diabetes mellitus without complications: Secondary | ICD-10-CM | POA: Diagnosis not present

## 2021-09-20 DIAGNOSIS — Z823 Family history of stroke: Secondary | ICD-10-CM | POA: Diagnosis not present

## 2021-09-20 DIAGNOSIS — Z7983 Long term (current) use of bisphosphonates: Secondary | ICD-10-CM | POA: Diagnosis not present

## 2021-09-20 DIAGNOSIS — Z7982 Long term (current) use of aspirin: Secondary | ICD-10-CM | POA: Diagnosis not present

## 2021-09-20 DIAGNOSIS — I499 Cardiac arrhythmia, unspecified: Secondary | ICD-10-CM | POA: Diagnosis not present

## 2021-09-20 DIAGNOSIS — Z008 Encounter for other general examination: Secondary | ICD-10-CM | POA: Diagnosis not present

## 2021-09-20 DIAGNOSIS — K8020 Calculus of gallbladder without cholecystitis without obstruction: Secondary | ICD-10-CM | POA: Diagnosis not present

## 2021-09-20 DIAGNOSIS — K219 Gastro-esophageal reflux disease without esophagitis: Secondary | ICD-10-CM | POA: Diagnosis not present

## 2021-10-13 ENCOUNTER — Ambulatory Visit: Payer: Medicare HMO | Admitting: Nurse Practitioner

## 2021-11-02 ENCOUNTER — Other Ambulatory Visit (HOSPITAL_COMMUNITY): Payer: Self-pay | Admitting: Family Medicine

## 2021-11-02 ENCOUNTER — Other Ambulatory Visit: Payer: Self-pay | Admitting: Family Medicine

## 2021-11-02 DIAGNOSIS — Z136 Encounter for screening for cardiovascular disorders: Secondary | ICD-10-CM

## 2021-11-02 DIAGNOSIS — L293 Anogenital pruritus, unspecified: Secondary | ICD-10-CM | POA: Diagnosis not present

## 2021-11-03 DIAGNOSIS — R350 Frequency of micturition: Secondary | ICD-10-CM | POA: Diagnosis not present

## 2021-11-04 ENCOUNTER — Ambulatory Visit (HOSPITAL_COMMUNITY)
Admission: RE | Admit: 2021-11-04 | Discharge: 2021-11-04 | Disposition: A | Discharge status: Home / Self Care | Payer: Medicare HMO | Source: Ambulatory Visit | Attending: Family Medicine | Admitting: Family Medicine

## 2021-11-04 DIAGNOSIS — Z136 Encounter for screening for cardiovascular disorders: Secondary | ICD-10-CM | POA: Diagnosis not present

## 2021-11-04 DIAGNOSIS — E119 Type 2 diabetes mellitus without complications: Secondary | ICD-10-CM | POA: Diagnosis not present

## 2021-11-04 DIAGNOSIS — I10 Essential (primary) hypertension: Secondary | ICD-10-CM | POA: Diagnosis not present

## 2021-11-10 ENCOUNTER — Ambulatory Visit (HOSPITAL_COMMUNITY)
Admission: RE | Admit: 2021-11-10 | Discharge: 2021-11-10 | Disposition: A | Discharge status: Home / Self Care | Payer: Medicare HMO | Source: Ambulatory Visit | Attending: Family Medicine | Admitting: Family Medicine

## 2021-11-10 ENCOUNTER — Ambulatory Visit (HOSPITAL_COMMUNITY)
Admission: RE | Admit: 2021-11-10 | Discharge: 2021-11-10 | Disposition: A | Discharge status: Home / Self Care | Payer: Medicare HMO | Source: Ambulatory Visit | Attending: Internal Medicine | Admitting: Internal Medicine

## 2021-11-10 DIAGNOSIS — Z780 Asymptomatic menopausal state: Secondary | ICD-10-CM | POA: Diagnosis not present

## 2021-11-10 DIAGNOSIS — M810 Age-related osteoporosis without current pathological fracture: Secondary | ICD-10-CM | POA: Diagnosis not present

## 2021-11-10 DIAGNOSIS — Z1231 Encounter for screening mammogram for malignant neoplasm of breast: Secondary | ICD-10-CM | POA: Insufficient documentation

## 2021-11-10 DIAGNOSIS — Z13820 Encounter for screening for osteoporosis: Secondary | ICD-10-CM | POA: Insufficient documentation

## 2021-11-10 DIAGNOSIS — M85851 Other specified disorders of bone density and structure, right thigh: Secondary | ICD-10-CM | POA: Diagnosis not present

## 2021-11-13 IMAGING — MG DIGITAL SCREENING BILAT W/ TOMO W/ CAD
6 of 10 series · 6 of 30 positions shown · non-contrast
Comparison: Previous exam(s).

CLINICAL DATA: Screening.

EXAM:
DIGITAL SCREENING BILATERAL MAMMOGRAM WITH TOMO AND CAD

[R MLO synth-2D]
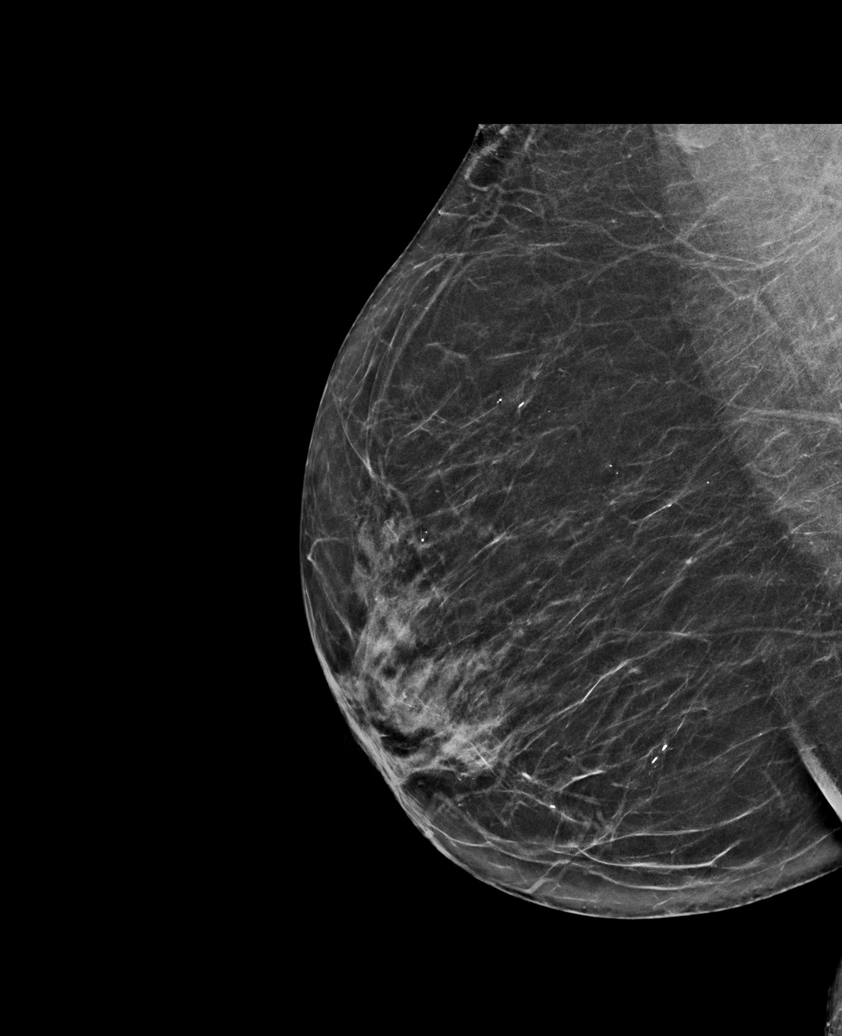

[L CC synth-2D (1 of 2)]
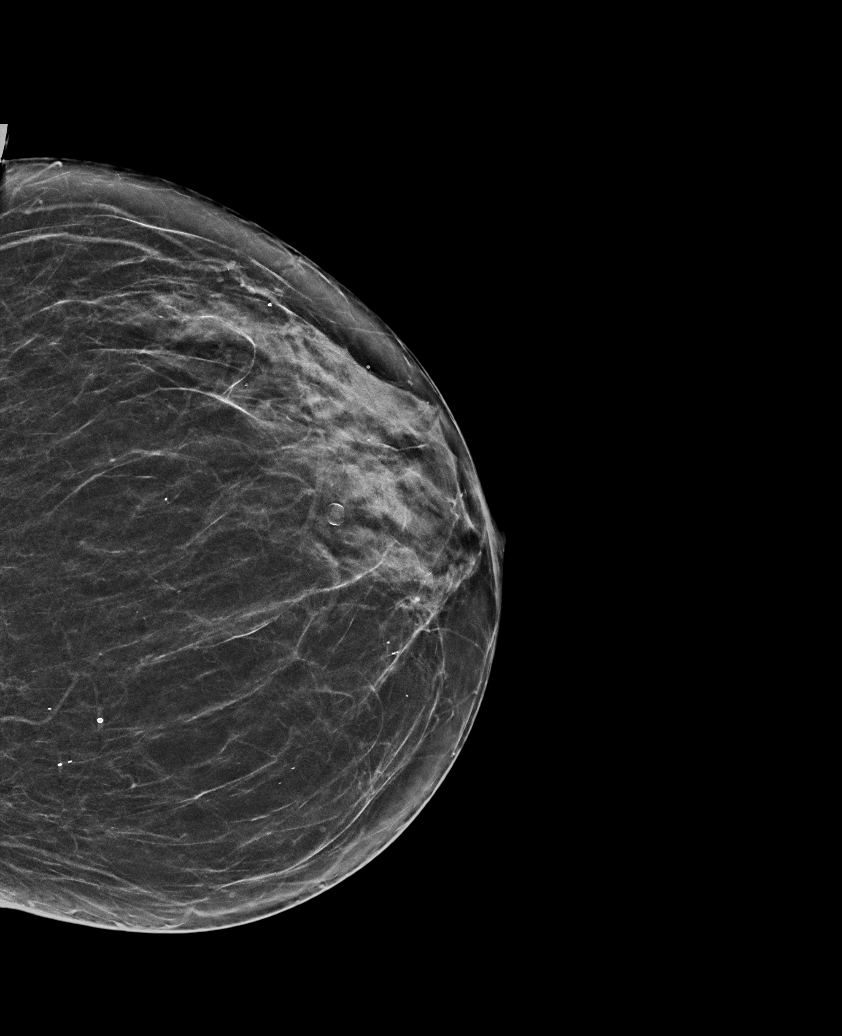

[L CC synth-2D (2 of 2)]
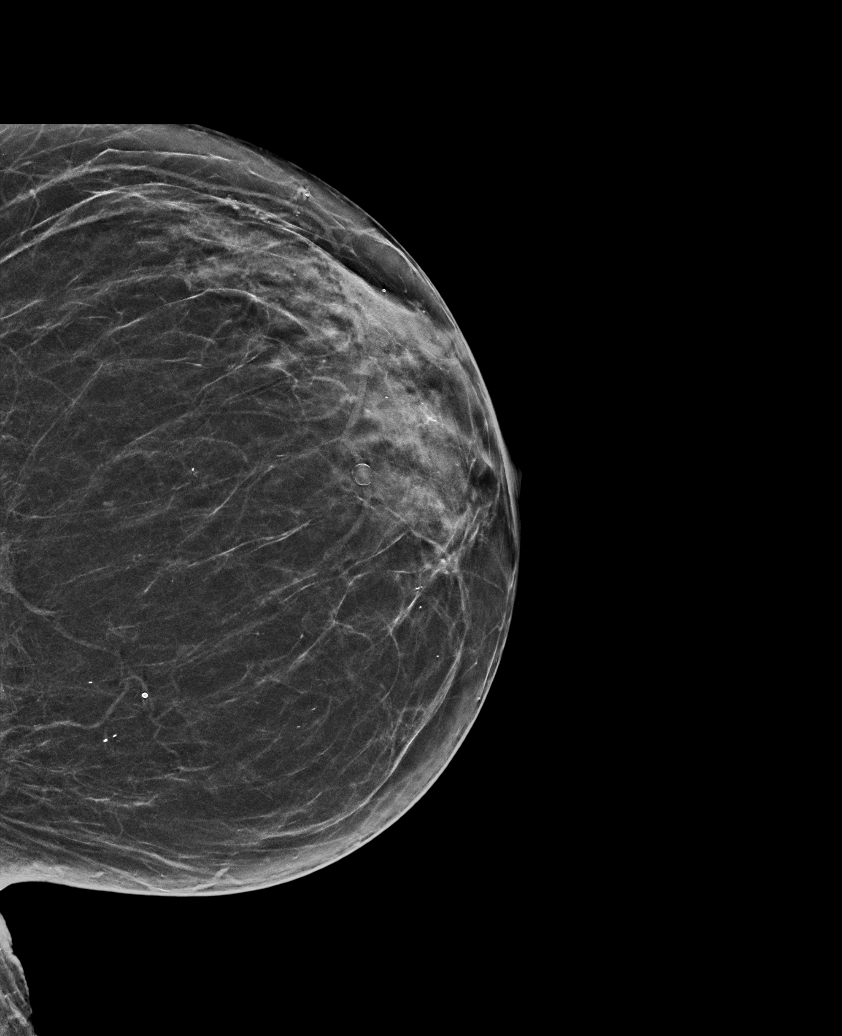

[R CC synth-2D]
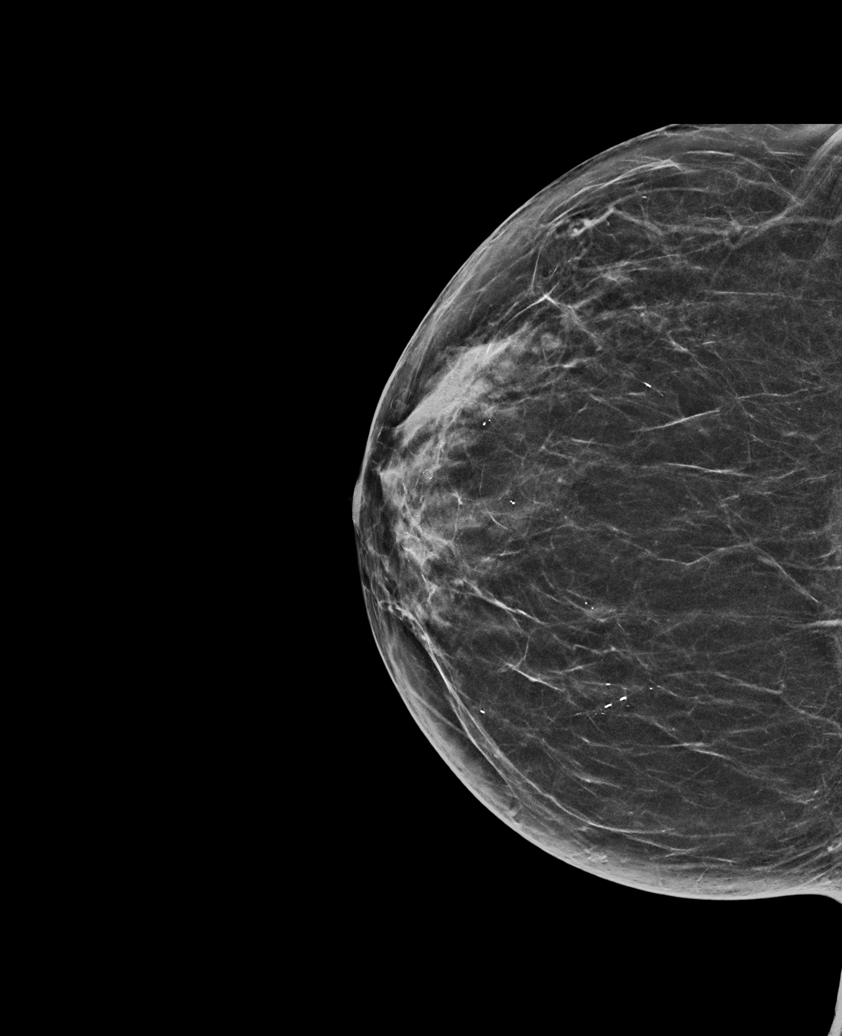

[L MLO synth-2D]
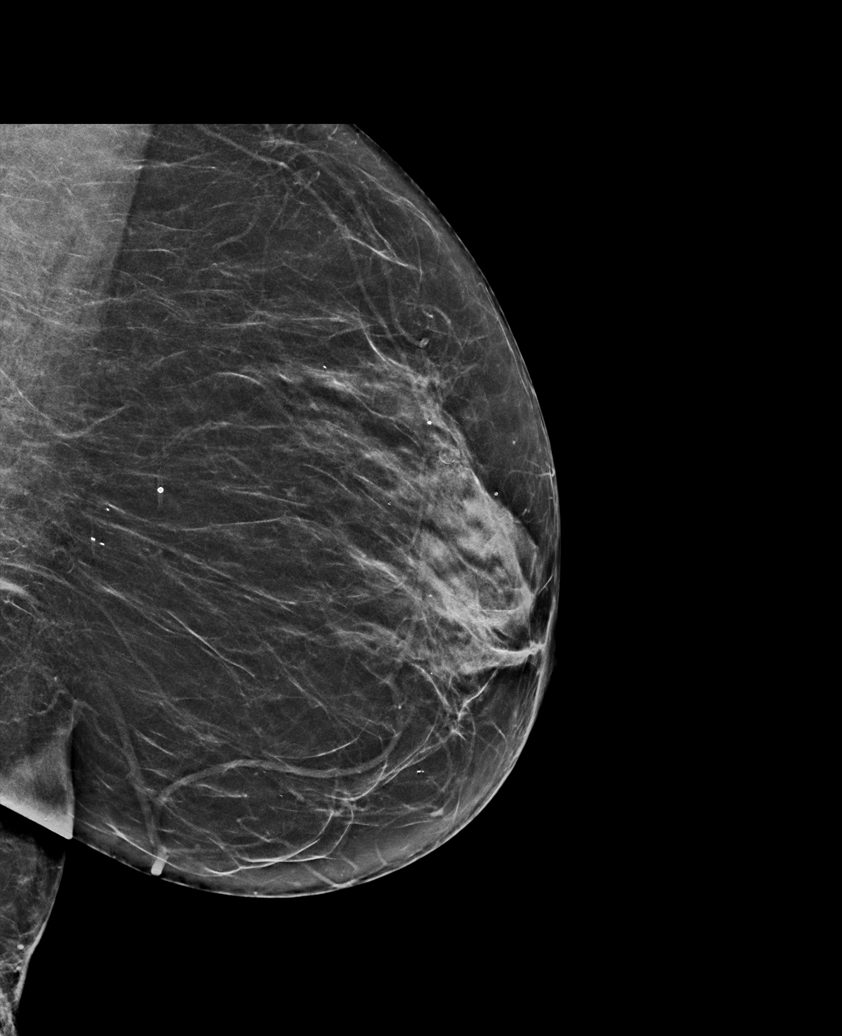

[R MLO tomo · tomo slice 31/61.0]
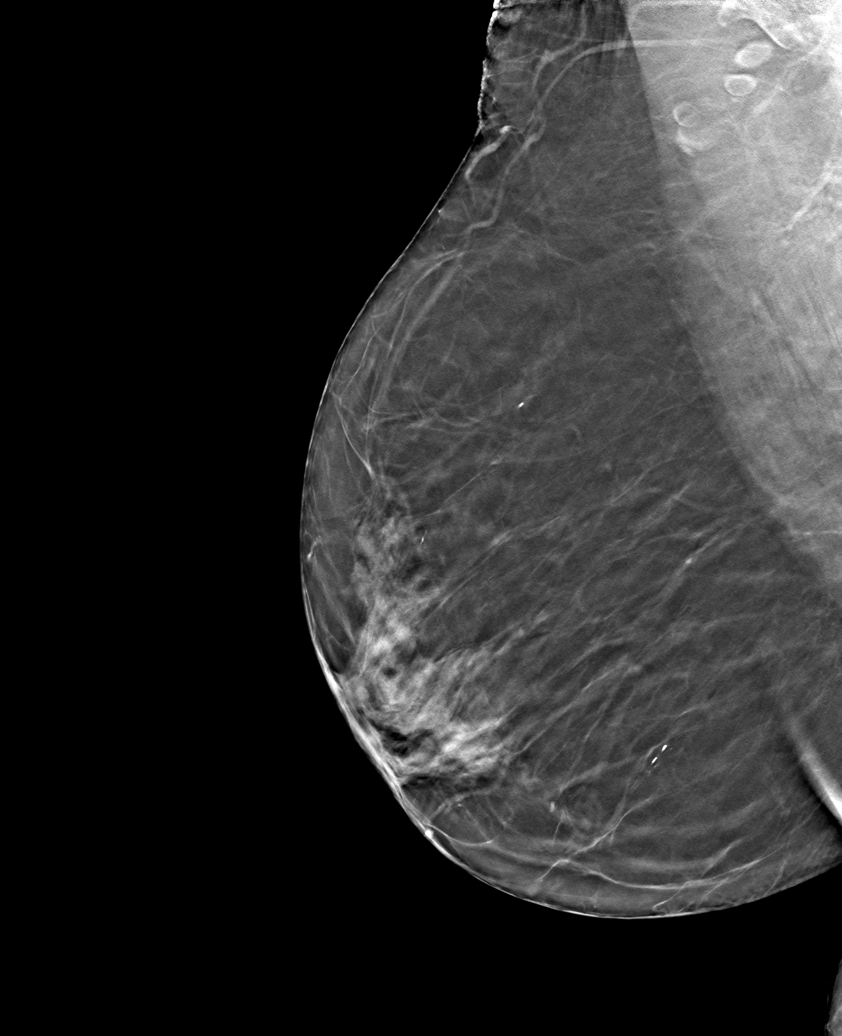

[6 of 30 positions shown; findings below may reference images not displayed]

ACR Breast Density Category b: There are scattered areas of
fibroglandular density.
FINDINGS: There are no findings suspicious for malignancy. Images were
processed with CAD.
IMPRESSION: No mammographic evidence of malignancy. A result letter of this
screening mammogram will be mailed directly to the patient.

RECOMMENDATION:
Screening mammogram in one year. (Code:[id number])

BI-RADS CATEGORY  1: Negative.

## 2021-11-18 LAB — BASIC METABOLIC PANEL
BUN: 25 — AB (ref 4–21)
CO2: 22 (ref 13–22)
Chloride: 106 (ref 99–108)
Creatinine: 0.9 (ref 0.5–1.1)
Glucose: 84
Potassium: 4.7 mEq/L (ref 3.5–5.1)
Sodium: 141 (ref 137–147)

## 2021-11-18 LAB — COMPREHENSIVE METABOLIC PANEL
Albumin: 4 (ref 3.5–5.0)
Calcium: 11.5 — AB (ref 8.7–10.7)
Globulin: 2.9

## 2021-11-18 LAB — VITAMIN D 25 HYDROXY (VIT D DEFICIENCY, FRACTURES): Vit D, 25-Hydroxy: 23

## 2021-11-18 LAB — HEPATIC FUNCTION PANEL
ALT: 18 U/L (ref 7–35)
AST: 24 (ref 13–35)
Alkaline Phosphatase: 98 (ref 25–125)
Bilirubin, Total: 0.4

## 2021-12-07 ENCOUNTER — Ambulatory Visit (INDEPENDENT_AMBULATORY_CARE_PROVIDER_SITE_OTHER): Payer: Medicare HMO | Admitting: "Endocrinology

## 2021-12-07 ENCOUNTER — Encounter: Payer: Self-pay | Admitting: "Endocrinology

## 2021-12-07 DIAGNOSIS — E210 Primary hyperparathyroidism: Secondary | ICD-10-CM

## 2021-12-07 DIAGNOSIS — M810 Age-related osteoporosis without current pathological fracture: Secondary | ICD-10-CM

## 2021-12-07 MED ORDER — CINACALCET HCL 30 MG PO TABS: 30.0000 mg | ORAL_TABLET | Freq: Every day | ORAL | 1 refills | Status: DC

## 2021-12-07 NOTE — Progress Notes (Signed)
?                                                          Endocrinology Consult Note  ?     12/07/2021, 6:06 PM ? ?Leslie Berry is a 81 y.o.-year-old female, referred by her  Benita Stabile, MD  , for evaluation for hypercalcemia/hyperparathyroidism. ? ? ?Past Medical History:  ?Diagnosis Date  ? Arthritis   ? Diabetes mellitus   ? Elevated LFTs   ? GERD (gastroesophageal reflux disease)   ? Gout   ? Hypertension   ? dr bland  pcp  ? Osteoporosis   ? ? ?Past Surgical History:  ?Procedure Laterality Date  ? CARPAL TUNNEL RELEASE    ? CARPAL TUNNEL RELEASE  12/22/2011  ? Procedure: CARPAL TUNNEL RELEASE;  Surgeon: Kennieth Rad, MD;  Location: [facility] OR;  Service: Orthopedics;  Laterality: Right;  ? CESAREAN SECTION    ? ROTATOR CUFF REPAIR    ? ? ?Social History  ? ?Tobacco Use  ? Smoking status: Former  ?  Types: Cigarettes  ?  Quit date: 05/15/1983  ?  Years since quitting: 38.5  ? Smokeless tobacco: Never  ?Vaping Use  ? Vaping Use: Never used  ?Substance Use Topics  ? Alcohol use: No  ? Drug use: No  ? ? ?Family History  ?Problem Relation Age of Onset  ? Thyroid disease Mother   ? Hypertension Mother   ? Hyperlipidemia Mother   ? Stroke Father   ? Cancer Sister   ? Hypertension Maternal Grandmother   ? Hypertension Maternal Grandfather   ? Anesthesia problems Neg Hx   ? ? ?Outpatient Encounter Medications as of 12/07/2021  ?Medication Sig  ? aspirin EC 81 MG tablet Take 81 mg by mouth daily. Swallow whole.  ? cinacalcet (SENSIPAR) 30 MG tablet Take 1 tablet (30 mg total) by mouth daily with breakfast.  ? acetaminophen (TYLENOL) 500 MG tablet Take 1,000 mg by mouth every 12 (twelve) hours as needed. Arthritis  ? alendronate (FOSAMAX) 70 MG tablet Take 70 mg by mouth once a week.  ? allopurinol (ZYLOPRIM) 100 MG tablet Take 100 mg by mouth daily.  ? amLODipine (NORVASC) 10 MG tablet Take 10 mg by mouth daily.  ? colchicine 0.6 MG tablet Take 0.6 mg by mouth daily.  ? metFORMIN (GLUCOPHAGE) 500 MG tablet Take 500 mg by  mouth daily.  ? omeprazole (PRILOSEC) 20 MG capsule Take 20 mg by mouth daily.  ? ursodiol (ACTIGALL) 500 MG tablet Take 500 mg by mouth in the morning and at bedtime.   ? [DISCONTINUED] doxazosin (CARDURA) 2 MG tablet Take 2 mg by mouth at bedtime.  ? [DISCONTINUED] PROCTOFOAM HC rectal foam PLACE 1 APPLICATOR RECTALLY 2 TIMES DAILY.  ? [DISCONTINUED] terconazole (TERAZOL 7) 0.4 % vaginal cream Place 1 applicator vaginally at bedtime.  ? ?No facility-administered encounter medications on file as of 12/07/2021.  ? ? ?No Known Allergies ? ? ?HPI  ?Leslie Berry was diagnosed with hypercalcemia 2 to 3 years ago.   ?History is obtained directly from the patient as well as chart review.  Her previous recent records are not available to review.  Her most recent labs from November 15, 2021 showed elevated PTH 125 elevated ionized calcium 6.0 elevated calcium at  11.5, vitamin D deficiency at 29.   ? ?Patient has no previously known history of parathyroid, pituitary, adrenal dysfunctions; no family history of such dysfunctions. ?-She has medical history of osteoporosis, currently on alendronate 70 mg p.o. weekly.  She recalls taking this medication apparently for 3 years. ? ?Her most recent bone density is from April 2023 showing apparent improvement or stability of her bone density. ? ?No prior history of fragility fractures or falls. No history of  kidney stones.  She reports losing at least 2 inches of height over the years. ? ?No history of CKD.  ? ?she is not on HCTZ or other thiazide therapy.  Is currently on vitamin D supplement for mild vitamin D insufficiency.   ? ?she is not on calcium supplements,  she eats dairy and green, leafy, vegetables on average amounts. ? ?she does not have a family history of hypercalcemia, pituitary tumors, thyroid cancer, or osteoporosis.  ?Her other medical history includes gout, hypertension, controlled type 2 diabetes. ? ? ?ROS: ? ?Constitutional: + Remotely fluctuating body weight,   no fatigue, no subjective hyperthermia, no subjective hypothermia ?Eyes: no blurry vision, no xerophthalmia ?ENT: no sore throat, no nodules palpated in throat, no dysphagia/odynophagia, no hoarseness ?Cardiovascular: no Chest Pain, no Shortness of Breath, no palpitations, no leg swelling ?Respiratory: no cough, no shortness of breath  ?Gastrointestinal: no Nausea/Vomiting/Diarhhea ?Musculoskeletal: no muscle/joint aches ?Skin: no rashes ?Neurological: no tremors, no numbness, no tingling, no dizziness ?Psychiatric: no depression, no anxiety ? ?PE: ?BP 128/68   Pulse (!) 56   Ht 5\' 5"  (1.651 m)   Wt 152 lb 12.8 oz (69.3 kg)   BMI 25.43 kg/m? , Body mass index is 25.43 kg/m?. ?Wt Readings from Last 3 Encounters:  ?12/07/21 152 lb 12.8 oz (69.3 kg)  ?08/12/21 156 lb (70.8 kg)  ?02/17/21 157 lb 9.6 oz (71.5 kg)  ?  ?Constitutional: + BMI of 25.4, not in acute distress, normal state of mind ?Eyes: PERRLA, EOMI, no exophthalmos ?ENT: moist mucous membranes, no gross thyromegaly, no gross cervical lymphadenopathy ?Cardiovascular: normal precordial activity, Regular Rate and Rhythm, no Murmur/Rubs/Gallops ?Respiratory:  adequate breathing efforts, no gross chest deformity, Clear to auscultation bilaterally ?Gastrointestinal: abdomen soft, Non -tender, No distension, Bowel Sounds present ?Musculoskeletal: no gross deformities, strength intact in all four extremities ?Skin: moist, warm, no rashes ?Neurological: no tremor with outstretched hands, Deep tendon reflexes normal in bilateral lower extremities.   ? ? ?CMP ( most recent) ?CMP  ?   ?Component Value Date/Time  ? NA 141 11/18/2021 0000  ? K 4.7 11/18/2021 0000  ? CL 106 11/18/2021 0000  ? CO2 22 11/18/2021 0000  ? GLUCOSE 150 (H) 12/24/2011 1021  ? BUN 25 (A) 11/18/2021 0000  ? CREATININE 0.9 11/18/2021 0000  ? CREATININE 0.73 12/24/2011 1021  ? CALCIUM 11.5 (A) 11/18/2021 0000  ? PROT 7.9 12/12/2011 1312  ? ALBUMIN 4.0 11/18/2021 0000  ? AST 24 11/18/2021 0000  ?  ALT 18 11/18/2021 0000  ? ALKPHOS 98 11/18/2021 0000  ? BILITOT 0.3 12/12/2011 1312  ? GFRNONAA 83 (L) 12/24/2011 1021  ? GFRAA >90 12/24/2011 1021  ? ? ?DualFemur Total Right 11/10/2021 83.0 Osteopenia -2.2 0.730 g/cm2 ?1.1% - ?DualFemur Total Right 02/14/2019 80.2 Osteopenia -2.3 0.722 g/cm2 ?-3.0% Yes ?DualFemur Total Right 06/02/2016 77.5 Osteopenia -2.1 0.744 g/cm2 - ?- ?  ?DualFemur Total Mean 11/10/2021 83.0 Osteopenia -2.0 0.761 g/cm2 ?0.0% - ?DualFemur Total Mean 02/14/2019 80.2 Osteopenia -2.0 0.761 g/cm2 ?1.5% - ?DualFemur Total  Mean 06/02/2016 77.5 - - 0.750 g/cm2 - - ?  ?Left Forearm Radius 33% 11/10/2021 83.0 Osteoporosis -2.6 0.529 ?g/cm2 4.3% - ?Left Forearm Radius 33% 02/14/2019 80.2 Osteoporosis -2.9 0.508 ?g/cm2 1.8% - ?Left Forearm Radius 33% 06/02/2016 77.5 Osteoporosis -3.0 0.499 ?g/cm2 - - ? ? ?Assessment: ?1. Hypercalcemia / Hyperparathyroidism ? ?Plan: ?Patient has had significant elevation of calcium to 11.5 associated with high PTH of 125.  ?Evidently, she had diagnosis of hypercalcemia for at least 2 years, complicated by osteoporosis. ?I discussed complications of hypercalcemia with her. ? ? ?- I discussed with the patient about the physiology of calcium and parathyroid hormone, and possible  effects of  increased PTH/ Calcium , including kidney stones, cardiac dysrhythmias, osteoporosis, abdominal pain, etc.  ? ?- The work up so far is consistent with primary hyperparathyroidism . ?She is not a surgical candidate.  She would benefit from low-dose Sensipar.   ? ?I discussed and ordered Sensipar 30 mg p.o. daily with breakfast with plan to repeat labs and office visit in 3 months.   ? ? ?-Her bone density is not due until April 2025.  Her existing bones and density was reviewed.  She is advised to continue her alendronate 70 mg p.o. weekly.  She recalls taking this medication for 3 years at least, we will plan to proceed for 3 more years if she continues to tolerate his  medication. ? ? ?She is advised on fall precautions.  He is advised to maintain vitamin D and avoid calcium supplements for now. ? ? She is advised to continue follow-up with primary care doctor. ? ?- Time spent with the p

## 2021-12-09 ENCOUNTER — Ambulatory Visit: Payer: Medicare HMO | Admitting: Obstetrics & Gynecology

## 2021-12-12 DIAGNOSIS — I10 Essential (primary) hypertension: Secondary | ICD-10-CM | POA: Diagnosis not present

## 2021-12-12 DIAGNOSIS — M810 Age-related osteoporosis without current pathological fracture: Secondary | ICD-10-CM | POA: Diagnosis not present

## 2021-12-12 DIAGNOSIS — E119 Type 2 diabetes mellitus without complications: Secondary | ICD-10-CM | POA: Diagnosis not present

## 2021-12-15 DIAGNOSIS — R002 Palpitations: Secondary | ICD-10-CM | POA: Diagnosis not present

## 2021-12-15 DIAGNOSIS — K7460 Unspecified cirrhosis of liver: Secondary | ICD-10-CM | POA: Diagnosis not present

## 2021-12-15 DIAGNOSIS — M109 Gout, unspecified: Secondary | ICD-10-CM | POA: Diagnosis not present

## 2021-12-15 DIAGNOSIS — E119 Type 2 diabetes mellitus without complications: Secondary | ICD-10-CM | POA: Diagnosis not present

## 2021-12-15 DIAGNOSIS — R809 Proteinuria, unspecified: Secondary | ICD-10-CM | POA: Diagnosis not present

## 2021-12-15 DIAGNOSIS — I10 Essential (primary) hypertension: Secondary | ICD-10-CM | POA: Diagnosis not present

## 2021-12-15 DIAGNOSIS — K219 Gastro-esophageal reflux disease without esophagitis: Secondary | ICD-10-CM | POA: Diagnosis not present

## 2021-12-15 DIAGNOSIS — E213 Hyperparathyroidism, unspecified: Secondary | ICD-10-CM | POA: Diagnosis not present

## 2021-12-15 DIAGNOSIS — N3281 Overactive bladder: Secondary | ICD-10-CM | POA: Diagnosis not present

## 2021-12-15 DIAGNOSIS — M810 Age-related osteoporosis without current pathological fracture: Secondary | ICD-10-CM | POA: Diagnosis not present

## 2022-01-27 ENCOUNTER — Ambulatory Visit (INDEPENDENT_AMBULATORY_CARE_PROVIDER_SITE_OTHER): Payer: Medicare HMO | Admitting: Cardiology

## 2022-01-27 ENCOUNTER — Ambulatory Visit (INDEPENDENT_AMBULATORY_CARE_PROVIDER_SITE_OTHER): Payer: Medicare HMO

## 2022-01-27 ENCOUNTER — Other Ambulatory Visit: Payer: Self-pay | Admitting: Cardiology

## 2022-01-27 ENCOUNTER — Encounter: Payer: Self-pay | Admitting: Cardiology

## 2022-01-27 VITALS — BP 124/78 | HR 76 | Ht 65.0 in | Wt 153.8 lb

## 2022-01-27 DIAGNOSIS — E210 Primary hyperparathyroidism: Secondary | ICD-10-CM

## 2022-01-27 DIAGNOSIS — R002 Palpitations: Secondary | ICD-10-CM

## 2022-01-27 DIAGNOSIS — I491 Atrial premature depolarization: Secondary | ICD-10-CM

## 2022-01-27 DIAGNOSIS — R011 Cardiac murmur, unspecified: Secondary | ICD-10-CM

## 2022-01-27 NOTE — Assessment & Plan Note (Signed)
Endocrinology notes reviewed.  Currently on Cinacalcet.

## 2022-01-27 NOTE — Assessment & Plan Note (Signed)
Checking echocardiogram.  Soft systolic murmur heard.

## 2022-01-27 NOTE — Assessment & Plan Note (Signed)
Checking a [employer]IO monitor 14-day.  I reviewed a [employer] monitor from 9/22.  There was an episode of atrial fibrillation noted in one of the PDF files.  On the final monitor report however from the company there was no mention of atrial fibrillation.  The final report did include a mention of atrial fibrillation.  Unsure of duration.  Because of the ambiguity here, checking ZIO.  If atrial fibrillation is present, she would warrant anticoagulation for stroke prevention.  Previously, PACs/PVCs were noted.  PACs were more plentiful than PVCs.

## 2022-01-27 NOTE — Progress Notes (Signed)
Cardiology Office Note:    Date:  01/27/2022   ID:  Leslie, Berry May 21, 1941, MRN [id number]  PCP:  Benita Stabile, MD   [facility] Providers Cardiologist:  None     Referring MD: Lupita Raider, NP    History of Present Illness:    Leslie Berry is a 81 y.o. female here for the evaluation of palpitations at the request of Dr. Lynita Lombard, NP.  She has previously been diagnosed with hypercalcemia, recently seeing endocrinology.  Note reviewed.  She is not on hydrochlorothiazide.  No calcium supplements.  Her work-up was consistent with primary hyperparathyroidism with parathyroid hormone level of 125, elevated and serum calcium level of 11.5.  Cardiac dysrhythmias can coincide.  She was not felt to be a surgical candidate.  She was started on Sensipar 30 mg.  On 04/19/2021 during a [employer] event monitor on day 28 there is a PDF of atrial fibrillation present at heart rate of approximately 70 bpm.  It lasted for approximately 1 minute at least from the strips that are provided.  Interestingly, on the final report there is no mention of atrial fibrillation by the company.  She is not on anticoagulation.  There were frequent PACs noted on the monitor at 11%.  Past Medical History:  Diagnosis Date   Arthritis    Diabetes mellitus    Elevated LFTs    GERD (gastroesophageal reflux disease)    Gout    Hypertension    dr bland  pcp   Osteoporosis     Past Surgical History:  Procedure Laterality Date   CARPAL TUNNEL RELEASE     CARPAL TUNNEL RELEASE  12/22/2011   Procedure: CARPAL TUNNEL RELEASE;  Surgeon: Kennieth Rad, MD;  Location: [facility] OR;  Service: Orthopedics;  Laterality: Right;   CESAREAN SECTION     ROTATOR CUFF REPAIR      Current Medications: Current Meds  Medication Sig   acetaminophen (TYLENOL) 500 MG tablet Take 1,000 mg by mouth every 12 (twelve) hours as needed. Arthritis   alendronate (FOSAMAX) 70 MG tablet Take 70 mg by mouth once a week.    allopurinol (ZYLOPRIM) 100 MG tablet Take 100 mg by mouth daily.   amLODipine (NORVASC) 10 MG tablet Take 10 mg by mouth daily.   aspirin EC 81 MG tablet Take 81 mg by mouth daily. Swallow whole.   cinacalcet (SENSIPAR) 30 MG tablet Take 1 tablet (30 mg total) by mouth daily with breakfast.   colchicine 0.6 MG tablet Take 0.6 mg by mouth daily.   metFORMIN (GLUCOPHAGE) 500 MG tablet Take 500 mg by mouth daily.   omeprazole (PRILOSEC) 20 MG capsule Take 20 mg by mouth daily.   ursodiol (ACTIGALL) 500 MG tablet Take 500 mg by mouth in the morning and at bedtime.      Allergies:   Patient has no known allergies.   Social History   Socioeconomic History   Marital status: Married    Spouse name: Not on file   Number of children: Not on file   Years of education: Not on file   Highest education level: Not on file  Occupational History   Not on file  Tobacco Use   Smoking status: Former    Types: Cigarettes    Quit date: 05/15/1983    Years since quitting: 38.7   Smokeless tobacco: Never  Vaping Use   Vaping Use: Never used  Substance and Sexual Activity   Alcohol use: No  Drug use: No   Sexual activity: Not Currently    Birth control/protection: Post-menopausal  Other Topics Concern   Not on file  Social History Narrative   Not on file   Social Determinants of Health   Financial Resource Strain: Not on file  Food Insecurity: Not on file  Transportation Needs: Not on file  Physical Activity: Not on file  Stress: Not on file  Social Connections: Not on file     Family History: The patient's family history includes Cancer in her sister; Hyperlipidemia in her mother; Hypertension in her maternal grandfather, maternal grandmother, and mother; Stroke in her father; Thyroid disease in her mother. There is no history of Anesthesia problems.  ROS:   Please see the history of present illness.    No syncope all other systems reviewed and are negative.  EKGs/Labs/Other  Studies Reviewed:    The following studies were reviewed today: Prior office notes reviewed.  Monitor results reviewed.  EKG:  EKG is  ordered today.  The ekg ordered today demonstrates sinus rhythm with PACs, heart rate 89 bpm PVC noted.  Recent Labs: 11/18/2021: ALT 18; BUN 25; Creatinine 0.9; Potassium 4.7; Sodium 141  Recent Lipid Panel No results found for: "CHOL", "TRIG", "HDL", "CHOLHDL", "VLDL", "LDLCALC", "LDLDIRECT"   Risk Assessment/Calculations:              Physical Exam:    VS:  BP 124/78   Pulse 76   Ht 5\' 5"  (1.651 m)   Wt 153 lb 12.8 oz (69.8 kg)   SpO2 95%   BMI 25.59 kg/m     Wt Readings from Last 3 Encounters:  01/27/22 153 lb 12.8 oz (69.8 kg)  12/07/21 152 lb 12.8 oz (69.3 kg)  08/12/21 156 lb (70.8 kg)     GEN:  Well nourished, well developed in no acute distress HEENT: Normal NECK: No JVD; No carotid bruits LYMPHATICS: No lymphadenopathy CARDIAC: Ectopy noted RRR, 1/6 systolic murmur, no rubs, gallops RESPIRATORY:  Clear to auscultation without rales, wheezing or rhonchi  ABDOMEN: Soft, non-tender, non-distended MUSCULOSKELETAL:  No edema; No deformity  SKIN: Warm and dry NEUROLOGIC:  Alert and oriented x 3 PSYCHIATRIC:  Normal affect   ASSESSMENT:    1. Palpitations   2. Systolic murmur   3. Premature atrial contractions   4. Hyperparathyroidism, primary (HCC)    PLAN:    In order of problems listed above:  Palpitations Checking a ZIO monitor 14-day.  I reviewed a [employer] monitor from 9/22.  There was an episode of atrial fibrillation noted in one of the PDF files.  On the final monitor report however from the company there was no mention of atrial fibrillation.  The final report did include a mention of atrial fibrillation.  Unsure of duration.  Because of the ambiguity here, checking ZIO.  If atrial fibrillation is present, she would warrant anticoagulation for stroke prevention.  Previously, PACs/PVCs were noted.  PACs  were more plentiful than PVCs.  Premature atrial contractions Previously noted on monitor from 2022.  Hypercalcemia can be related to cardiac arrhythmias.  As this is treated medically, hopefully palpitations will improve.  She only drinks 1/2 cup of coffee in the morning.  She could try to eliminate this.  No other caffeine.  Hyperparathyroidism, primary [facility]) Endocrinology notes reviewed.  Currently on Cinacalcet.  Systolic murmur Checking echocardiogram.  Soft systolic murmur heard.     We will follow-up with results of testing.  Ultimately, we can treat this conservatively  as long as it is just PACs or PVCs.  I did mention her the possibility of metoprolol and she would like to stay off of another additional medication if possible.  This is reasonable.   Medication Adjustments/Labs and Tests Ordered: Current medicines are reviewed at length with the patient today.  Concerns regarding medicines are outlined above.  Orders Placed This Encounter  Procedures   EKG 12-Lead   ECHOCARDIOGRAM COMPLETE   No orders of the defined types were placed in this encounter.   Patient Instructions  Medication Instructions:   Your physician recommends that you continue on your current medications as directed. Please refer to the Current Medication list given to you today.   Labwork: None today  Testing/Procedures: Your physician has requested that you have an echocardiogram. Echocardiography is a painless test that uses sound waves to create images of your heart. It provides your doctor with information about the size and shape of your heart and how well your heart's chambers and valves are working. This procedure takes approximately one hour. There are no restrictions for this procedure.    ZIO XT- Long Term Monitor Instructions   Your physician has requested you wear your ZIO patch monitor____14___days.   This is a single patch monitor.  [employer] supplies one patch monitor per  enrollment.  Additional stickers are not available.   Please do not apply patch if you will be having a Nuclear Stress Test, Echocardiogram, Cardiac CT, MRI, or Chest Xray during the time frame you would be wearing the monitor. The patch cannot be worn during these tests.  You cannot remove and re-apply the ZIO XT patch monitor. Do not shower for the first 24 hours.  You may shower after the first 24 hours.   Press button if you feel a symptom. You will hear a small click.  Record Date, Time and Symptom in the Patient Log Book.   When you are ready to remove patch, follow instructions on last 2 pages of Patient Log Book.  Stick patch monitor onto last page of Patient Log Book.   Place Patient Log Book in [place] box.  Use locking tab on box and tape box closed securely.  The [employer] and [employer] has [employer] on it.  Please place in mailbox as soon as possible.  Your physician should have your test results approximately 7 days after the monitor has been mailed back to [facility].   Call [facility] Customer Care at [phone] if you have questions regarding your ZIO XT patch monitor.  Call them immediately if you see an orange light blinking on your monitor.   If your monitor falls off in less than 4 days contact our Monitor department at [phone].  If your monitor becomes loose or falls off after 4 days call [employer] at [phone] for suggestions on securing your monitor.    Follow-Up: As needed  Any Other Special Instructions Will Be Listed Below (If Applicable).  If you need a refill on your cardiac medications before your next appointment, please call your pharmacy.    Signed, Donato Schultz, MD  01/27/2022 3:10 PM    [facility]

## 2022-01-27 NOTE — Assessment & Plan Note (Signed)
Previously noted on monitor from 2022.  Hypercalcemia can be related to cardiac arrhythmias.  As this is treated medically, hopefully palpitations will improve.  She only drinks 1/2 cup of coffee in the morning.  She could try to eliminate this.  No other caffeine.

## 2022-01-27 NOTE — Patient Instructions (Signed)
Medication Instructions:   Your physician recommends that you continue on your current medications as directed. Please refer to the Current Medication list given to you today.   Labwork: None today  Testing/Procedures: Your physician has requested that you have an echocardiogram. Echocardiography is a painless test that uses sound waves to create images of your heart. It provides your doctor with information about the size and shape of your heart and how well your heart's chambers and valves are working. This procedure takes approximately one hour. There are no restrictions for this procedure.    ZIO XT- Long Term Monitor Instructions   Your physician has requested you wear your ZIO patch monitor____14___days.   This is a single patch monitor.  [employer] supplies one patch monitor per enrollment.  Additional stickers are not available.   Please do not apply patch if you will be having a Nuclear Stress Test, Echocardiogram, Cardiac CT, MRI, or Chest Xray during the time frame you would be wearing the monitor. The patch cannot be worn during these tests.  You cannot remove and re-apply the ZIO XT patch monitor. Do not shower for the first 24 hours.  You may shower after the first 24 hours.   Press button if you feel a symptom. You will hear a small click.  Record Date, Time and Symptom in the Patient Log Book.   When you are ready to remove patch, follow instructions on last 2 pages of Patient Log Book.  Stick patch monitor onto last page of Patient Log Book.   Place Patient Log Book in [place] box.  Use locking tab on box and tape box closed securely.  The [employer] has [employer] on it.  Please place in mailbox as soon as possible.  Your physician should have your test results approximately 7 days after the monitor has been mailed back to [facility].   Call [facility] Customer Care at [phone] if you have questions regarding your ZIO XT patch monitor.  Call them  immediately if you see an orange light blinking on your monitor.   If your monitor falls off in less than 4 days contact our Monitor department at [phone].  If your monitor becomes loose or falls off after 4 days call [employer] at [phone] for suggestions on securing your monitor.    Follow-Up: As needed  Any Other Special Instructions Will Be Listed Below (If Applicable).  If you need a refill on your cardiac medications before your next appointment, please call your pharmacy.

## 2022-02-13 ENCOUNTER — Ambulatory Visit (HOSPITAL_COMMUNITY)
Admission: RE | Admit: 2022-02-13 | Discharge: 2022-02-13 | Disposition: A | Discharge status: Home / Self Care | Payer: Medicare HMO | Source: Ambulatory Visit | Attending: Cardiology | Admitting: Cardiology

## 2022-02-13 DIAGNOSIS — R011 Cardiac murmur, unspecified: Secondary | ICD-10-CM | POA: Insufficient documentation

## 2022-02-13 LAB — ECHOCARDIOGRAM COMPLETE
Area-P 1/2: 2.29 cm2
S' Lateral: 2.4 cm

## 2022-02-13 NOTE — Progress Notes (Signed)
*  PRELIMINARY RESULTS* Echocardiogram 2D Echocardiogram has been performed.  Stacey Drain 02/13/2022, 9:09 AM

## 2022-02-16 DIAGNOSIS — R002 Palpitations: Secondary | ICD-10-CM | POA: Diagnosis not present

## 2022-03-03 DIAGNOSIS — E210 Primary hyperparathyroidism: Secondary | ICD-10-CM | POA: Diagnosis not present

## 2022-03-09 ENCOUNTER — Encounter: Payer: Self-pay | Admitting: "Endocrinology

## 2022-03-09 ENCOUNTER — Ambulatory Visit: Payer: Medicare HMO | Admitting: "Endocrinology

## 2022-03-09 DIAGNOSIS — E210 Primary hyperparathyroidism: Secondary | ICD-10-CM

## 2022-03-09 DIAGNOSIS — M810 Age-related osteoporosis without current pathological fracture: Secondary | ICD-10-CM | POA: Diagnosis not present

## 2022-03-09 MED ORDER — MAGNESIUM 250 MG PO TABS: 1.0000 | ORAL_TABLET | Freq: Every day | ORAL | 1 refills | Status: AC

## 2022-03-09 NOTE — Progress Notes (Signed)
03/09/2022, 9:06 AM  Endocrinology follow-up note  Leslie Berry is a 81 y.o.-year-old female, referred by her  Benita Stabile, MD  , for evaluation for hypercalcemia/hyperparathyroidism.   Past Medical History:  Diagnosis Date   Arthritis    Diabetes mellitus    Elevated LFTs    GERD (gastroesophageal reflux disease)    Gout    Hypertension    dr bland  pcp   Osteoporosis     Past Surgical History:  Procedure Laterality Date   CARPAL TUNNEL RELEASE     CARPAL TUNNEL RELEASE  12/22/2011   Procedure: CARPAL TUNNEL RELEASE;  Surgeon: Kennieth Rad, MD;  Location: [facility] OR;  Service: Orthopedics;  Laterality: Right;   CESAREAN SECTION     ROTATOR CUFF REPAIR      Social History   Tobacco Use   Smoking status: Former    Types: Cigarettes    Quit date: 05/15/1983    Years since quitting: 38.8   Smokeless tobacco: Never  Vaping Use   Vaping Use: Never used  Substance Use Topics   Alcohol use: No   Drug use: No    Family History  Problem Relation Age of Onset   Thyroid disease Mother    Hypertension Mother    Hyperlipidemia Mother    Stroke Father    Cancer Sister    Hypertension Maternal Grandmother    Hypertension Maternal Grandfather    Anesthesia problems Neg Hx     Outpatient Encounter Medications as of 03/09/2022  Medication Sig   Magnesium 250 MG TABS Take 1 tablet (250 mg total) by mouth daily with breakfast.   acetaminophen (TYLENOL) 500 MG tablet Take 1,000 mg by mouth every 12 (twelve) hours as needed. Arthritis   alendronate (FOSAMAX) 70 MG tablet Take 70 mg by mouth once a week.   allopurinol (ZYLOPRIM) 100 MG tablet Take 100 mg by mouth daily.   amLODipine (NORVASC) 10 MG tablet Take 10 mg by mouth daily.   aspirin EC 81 MG tablet Take 81 mg by mouth daily. Swallow whole.   cinacalcet (SENSIPAR) 30 MG tablet Take 1 tablet (30 mg total) by mouth daily with breakfast.   colchicine 0.6 MG  tablet Take 0.6 mg by mouth daily.   metFORMIN (GLUCOPHAGE) 500 MG tablet Take 500 mg by mouth daily.   omeprazole (PRILOSEC) 20 MG capsule Take 20 mg by mouth daily.   ursodiol (ACTIGALL) 500 MG tablet Take 500 mg by mouth in the morning and at bedtime.    No facility-administered encounter medications on file as of 03/09/2022.    No Known Allergies   HPI  Leslie Berry was diagnosed with hypercalcemia 2 to 3 years ago.   She was seen for significant hypercalcemia associated with high PTH consistent with primary hyperparathyroidism.  She was started on low-dose Sensipar 30 mg p.o. daily at breakfast.  She is responding with improved calcium at 10.2 from 11.5.  Her PTH is still high at 150.    Patient has no previously known history of pituitary, adrenal dysfunctions; no family history of such dysfunctions. -She has medical history  of osteoporosis, currently on alendronate 70 mg p.o. weekly.  She recalls taking this medication apparently for 3 years.  Her most recent bone density is from April 2023 showing apparent improvement or stability of her bone density.  No prior history of fragility fractures or falls. No history of  kidney stones.  She reports losing at least 2 inches of height over the years.  No history of CKD.  Her labs also show hypomagnesemia.  she is not on HCTZ or other thiazide therapy.  Is currently on vitamin D supplement for mild vitamin D insufficiency.    she is not on calcium supplements,  she eats dairy and green, leafy, vegetables on average amounts.  she does not have a family history of hypercalcemia, pituitary tumors, thyroid cancer, or osteoporosis.  Her other medical history includes gout, hypertension, controlled type 2 diabetes.   ROS:  Constitutional: + Minimally fluctuating body weight,  no fatigue, no subjective hyperthermia, no subjective hypothermia Eyes: no blurry vision, no xerophthalmia ENT: no sore throat, no nodules palpated in throat, no  dysphagia/odynophagia, no hoarseness Cardiovascular: no Chest Pain, no Shortness of Breath, no palpitations, no leg swelling Respiratory: no cough, no shortness of breath  Gastrointestinal: no Nausea/Vomiting/Diarhhea Musculoskeletal: no muscle/joint aches Skin: no rashes Neurological: no tremors, no numbness, no tingling, no dizziness Psychiatric: no depression, no anxiety  PE: BP 122/84   Pulse 88   Ht 5\' 5"  (1.651 m)   Wt 154 lb (69.9 kg)   BMI 25.63 kg/m , Body mass index is 25.63 kg/m. Wt Readings from Last 3 Encounters:  03/09/22 154 lb (69.9 kg)  01/27/22 153 lb 12.8 oz (69.8 kg)  12/07/21 152 lb 12.8 oz (69.3 kg)    Constitutional: + BMI of 25.4, not in acute distress, normal state of mind Eyes: PERRLA, EOMI, no exophthalmos ENT: moist mucous membranes, no gross thyromegaly, no gross cervical lymphadenopathy    CMP ( most recent) CMP     Component Value Date/Time   NA 141 11/18/2021 0000   K 4.7 11/18/2021 0000   CL 106 11/18/2021 0000   CO2 22 11/18/2021 0000   GLUCOSE 150 (H) 12/24/2011 1021   BUN 25 (A) 11/18/2021 0000   CREATININE 0.9 11/18/2021 0000   CREATININE 0.73 12/24/2011 1021   CALCIUM 11.5 (A) 11/18/2021 0000   PROT 7.9 12/12/2011 1312   ALBUMIN 4.0 11/18/2021 0000   AST 24 11/18/2021 0000   ALT 18 11/18/2021 0000   ALKPHOS 98 11/18/2021 0000   BILITOT 0.3 12/12/2011 1312   GFRNONAA 83 (L) 12/24/2011 1021   GFRAA >90 12/24/2011 1021    DualFemur Total Right 11/10/2021 83.0 Osteopenia -2.2 0.730 g/cm2 1.1% - DualFemur Total Right 02/14/2019 80.2 Osteopenia -2.3 0.722 g/cm2 -3.0% Yes DualFemur Total Right 06/02/2016 77.5 Osteopenia -2.1 0.744 g/cm2 - -   DualFemur Total Mean 11/10/2021 83.0 Osteopenia -2.0 0.761 g/cm2 0.0% - DualFemur Total Mean 02/14/2019 80.2 Osteopenia -2.0 0.761 g/cm2 1.5% - DualFemur Total Mean 06/02/2016 77.5 - - 0.750 g/cm2 - -   Left Forearm Radius 33% 11/10/2021 83.0 Osteoporosis -2.6 0.529 g/cm2 4.3%  - Left Forearm Radius 33% 02/14/2019 80.2 Osteoporosis -2.9 0.508 g/cm2 1.8% - Left Forearm Radius 33% 06/02/2016 77.5 Osteoporosis -3.0 0.499 g/cm2 - -   Assessment: 1. Hypercalcemia / Hyperparathyroidism  Plan: I discussed her with her.  Her calcium is improving to 10.2 from 11.5.  PTH remains high at 150.    Evidently, she had diagnosis of hypercalcemia for at least 2 years, complicated  by osteoporosis. I discussed complications of hypercalcemia with her.  She will benefit from staying on Sensipar.  I advised her to continue Sensipar 30 mg p.o. daily at breakfast with plan to repeat labs in 4 months.   - I discussed with the patient about the physiology of calcium and parathyroid hormone, and possible  effects of  increased PTH/ Calcium , including kidney stones, cardiac dysrhythmias, osteoporosis, abdominal pain, etc.   - The work up so far is consistent with primary hyperparathyroidism . She is not a surgical candidate.      -Her next bone density is not due until April 2025.  Her existing bones and density was reviewed.  She is advised to continue her alendronate 70 mg p.o. weekly.  She recalls taking this medication for 3 years at least, we will plan to proceed with alendronate long as she continues to tolerate.     She is advised on fall precautions.  He is advised to maintain vitamin D and avoid calcium supplements for now.  For hypomagnesemia, I discussed and added magnesium 250 mg p.o. daily at breakfast.   She is advised to continue follow-up with primary care doctor.   I spent 25 minutes in the care of the patient today including review of labs from Thyroid Function, CMP, and other relevant labs ; imaging/biopsy records (current and previous including abstractions from other facilities); face-to-face time discussing  her lab results and symptoms, medications doses, her options of short and long term treatment based on the latest standards of care / guidelines;   and  documenting the encounter.  Dow Adolph  participated in the discussions, expressed understanding, and voiced agreement with the above plans.  All questions were answered to her satisfaction. she is encouraged to contact clinic should she have any questions or concerns prior to her return visit.   - Return in about 4 months (around 07/09/2022) for F/U with Pre-visit Labs.   Marquis Lunch, MD [facility] [address] [place], [place] [address] Phone: [phone]  Fax: [phone]    This note was partially dictated with voice recognition software. Similar sounding words can be transcribed inadequately or may not  be corrected upon review.  03/09/2022, 9:06 AM

## 2022-03-17 DIAGNOSIS — E119 Type 2 diabetes mellitus without complications: Secondary | ICD-10-CM | POA: Diagnosis not present

## 2022-03-17 DIAGNOSIS — I10 Essential (primary) hypertension: Secondary | ICD-10-CM | POA: Diagnosis not present

## 2022-03-17 DIAGNOSIS — M810 Age-related osteoporosis without current pathological fracture: Secondary | ICD-10-CM | POA: Diagnosis not present

## 2022-03-22 DIAGNOSIS — I10 Essential (primary) hypertension: Secondary | ICD-10-CM | POA: Diagnosis not present

## 2022-03-22 DIAGNOSIS — N3281 Overactive bladder: Secondary | ICD-10-CM | POA: Diagnosis not present

## 2022-03-22 DIAGNOSIS — E785 Hyperlipidemia, unspecified: Secondary | ICD-10-CM | POA: Diagnosis not present

## 2022-03-22 DIAGNOSIS — R809 Proteinuria, unspecified: Secondary | ICD-10-CM | POA: Diagnosis not present

## 2022-03-22 DIAGNOSIS — K219 Gastro-esophageal reflux disease without esophagitis: Secondary | ICD-10-CM | POA: Diagnosis not present

## 2022-03-22 DIAGNOSIS — Z0001 Encounter for general adult medical examination with abnormal findings: Secondary | ICD-10-CM | POA: Diagnosis not present

## 2022-03-22 DIAGNOSIS — M810 Age-related osteoporosis without current pathological fracture: Secondary | ICD-10-CM | POA: Diagnosis not present

## 2022-03-22 DIAGNOSIS — E213 Hyperparathyroidism, unspecified: Secondary | ICD-10-CM | POA: Diagnosis not present

## 2022-03-22 DIAGNOSIS — M109 Gout, unspecified: Secondary | ICD-10-CM | POA: Diagnosis not present

## 2022-03-22 DIAGNOSIS — R002 Palpitations: Secondary | ICD-10-CM | POA: Diagnosis not present

## 2022-03-22 DIAGNOSIS — K7460 Unspecified cirrhosis of liver: Secondary | ICD-10-CM | POA: Diagnosis not present

## 2022-03-22 DIAGNOSIS — E1129 Type 2 diabetes mellitus with other diabetic kidney complication: Secondary | ICD-10-CM | POA: Diagnosis not present

## 2022-03-29 DIAGNOSIS — Z23 Encounter for immunization: Secondary | ICD-10-CM | POA: Diagnosis not present

## 2022-03-29 DIAGNOSIS — M545 Low back pain, unspecified: Secondary | ICD-10-CM | POA: Diagnosis not present

## 2022-04-20 DIAGNOSIS — K743 Primary biliary cirrhosis: Secondary | ICD-10-CM | POA: Diagnosis not present

## 2022-04-20 DIAGNOSIS — K625 Hemorrhage of anus and rectum: Secondary | ICD-10-CM | POA: Diagnosis not present

## 2022-04-20 DIAGNOSIS — Z800 Family history of malignant neoplasm of digestive organs: Secondary | ICD-10-CM | POA: Diagnosis not present

## 2022-04-20 DIAGNOSIS — K5730 Diverticulosis of large intestine without perforation or abscess without bleeding: Secondary | ICD-10-CM | POA: Diagnosis not present

## 2022-04-20 DIAGNOSIS — K219 Gastro-esophageal reflux disease without esophagitis: Secondary | ICD-10-CM | POA: Diagnosis not present

## 2022-04-30 ENCOUNTER — Other Ambulatory Visit: Payer: Self-pay | Admitting: "Endocrinology

## 2022-06-26 DIAGNOSIS — I10 Essential (primary) hypertension: Secondary | ICD-10-CM | POA: Diagnosis not present

## 2022-06-26 DIAGNOSIS — M109 Gout, unspecified: Secondary | ICD-10-CM | POA: Diagnosis not present

## 2022-06-26 DIAGNOSIS — M810 Age-related osteoporosis without current pathological fracture: Secondary | ICD-10-CM | POA: Diagnosis not present

## 2022-06-26 DIAGNOSIS — E1129 Type 2 diabetes mellitus with other diabetic kidney complication: Secondary | ICD-10-CM | POA: Diagnosis not present

## 2022-06-30 DIAGNOSIS — I10 Essential (primary) hypertension: Secondary | ICD-10-CM | POA: Diagnosis not present

## 2022-06-30 DIAGNOSIS — N3281 Overactive bladder: Secondary | ICD-10-CM | POA: Diagnosis not present

## 2022-06-30 DIAGNOSIS — K219 Gastro-esophageal reflux disease without esophagitis: Secondary | ICD-10-CM | POA: Diagnosis not present

## 2022-06-30 DIAGNOSIS — M545 Low back pain, unspecified: Secondary | ICD-10-CM | POA: Diagnosis not present

## 2022-06-30 DIAGNOSIS — R002 Palpitations: Secondary | ICD-10-CM | POA: Diagnosis not present

## 2022-06-30 DIAGNOSIS — M810 Age-related osteoporosis without current pathological fracture: Secondary | ICD-10-CM | POA: Diagnosis not present

## 2022-06-30 DIAGNOSIS — M109 Gout, unspecified: Secondary | ICD-10-CM | POA: Diagnosis not present

## 2022-06-30 DIAGNOSIS — K7460 Unspecified cirrhosis of liver: Secondary | ICD-10-CM | POA: Diagnosis not present

## 2022-06-30 DIAGNOSIS — R809 Proteinuria, unspecified: Secondary | ICD-10-CM | POA: Diagnosis not present

## 2022-06-30 DIAGNOSIS — E1129 Type 2 diabetes mellitus with other diabetic kidney complication: Secondary | ICD-10-CM | POA: Diagnosis not present

## 2022-06-30 DIAGNOSIS — E213 Hyperparathyroidism, unspecified: Secondary | ICD-10-CM | POA: Diagnosis not present

## 2022-06-30 DIAGNOSIS — E785 Hyperlipidemia, unspecified: Secondary | ICD-10-CM | POA: Diagnosis not present

## 2022-07-10 ENCOUNTER — Ambulatory Visit: Payer: Medicare HMO | Admitting: "Endocrinology

## 2022-07-28 DIAGNOSIS — E210 Primary hyperparathyroidism: Secondary | ICD-10-CM | POA: Diagnosis not present

## 2022-08-02 ENCOUNTER — Ambulatory Visit: Payer: Medicare HMO | Admitting: "Endocrinology

## 2022-08-02 ENCOUNTER — Encounter: Payer: Self-pay | Admitting: "Endocrinology

## 2022-08-02 VITALS — BP 128/80 | HR 84 | Ht 65.5 in | Wt 153.6 lb

## 2022-08-02 DIAGNOSIS — M810 Age-related osteoporosis without current pathological fracture: Secondary | ICD-10-CM

## 2022-08-02 DIAGNOSIS — E210 Primary hyperparathyroidism: Secondary | ICD-10-CM | POA: Diagnosis not present

## 2022-08-02 NOTE — Progress Notes (Signed)
08/02/2022, 11:32 AM  Endocrinology follow-up note  Leslie Berry is a 81 y.o.-year-old female, referred by her  Celene Squibb, MD  , for evaluation for hypercalcemia/hyperparathyroidism.   Past Medical History:  Diagnosis Date   Arthritis    Diabetes mellitus    Elevated LFTs    GERD (gastroesophageal reflux disease)    Gout    Hypertension    dr bland  pcp   Osteoporosis     Past Surgical History:  Procedure Laterality Date   CARPAL TUNNEL RELEASE     CARPAL TUNNEL RELEASE  12/22/2011   Procedure: CARPAL TUNNEL RELEASE;  Surgeon: Sharmon Revere, MD;  Location: [facility];  Service: Orthopedics;  Laterality: Right;   CESAREAN SECTION     ROTATOR CUFF REPAIR      Social History   Tobacco Use   Smoking status: Former    Types: Cigarettes    Quit date: 05/15/1983    Years since quitting: 39.2   Smokeless tobacco: Never  Vaping Use   Vaping Use: Never used  Substance Use Topics   Alcohol use: No   Drug use: No    Family History  Problem Relation Age of Onset   Thyroid disease Mother    Hypertension Mother    Hyperlipidemia Mother    Stroke Father    Cancer Sister    Hypertension Maternal Grandmother    Hypertension Maternal Grandfather    Anesthesia problems Neg Hx     Outpatient Encounter Medications as of 08/02/2022  Medication Sig   acetaminophen (TYLENOL) 500 MG tablet Take 1,000 mg by mouth every 12 (twelve) hours as needed. Arthritis   alendronate (FOSAMAX) 70 MG tablet Take 70 mg by mouth once a week.   allopurinol (ZYLOPRIM) 100 MG tablet Take 100 mg by mouth daily.   amLODipine (NORVASC) 10 MG tablet Take 10 mg by mouth daily.   aspirin EC 81 MG tablet Take 81 mg by mouth daily. Swallow whole.   cinacalcet (SENSIPAR) 30 MG tablet TAKE 1 TABLET (30 MG TOTAL) BY MOUTH DAILY WITH BREAKFAST.   colchicine 0.6 MG tablet Take 0.6 mg by mouth daily.   Magnesium 250 MG TABS Take 1 tablet (250 mg  total) by mouth daily with breakfast.   metFORMIN (GLUCOPHAGE) 500 MG tablet Take 500 mg by mouth daily.   omeprazole (PRILOSEC) 20 MG capsule Take 20 mg by mouth daily.   ursodiol (ACTIGALL) 500 MG tablet Take 500 mg by mouth in the morning and at bedtime.    No facility-administered encounter medications on file as of 08/02/2022.    No Known Allergies   HPI  Leslie Berry was diagnosed with hypercalcemia 2 to 3 years ago.   She was seen for significant hypercalcemia associated with high PTH consistent with primary hyperparathyroidism.  She was started on low-dose Sensipar 30 mg p.o. daily at breakfast.  She is responding with improved calcium at 10.5, PTH improving to 137, she is also on magnesium supplement, corrected at 1.7.     Patient has no previously known history of pituitary, adrenal dysfunctions; no family history of such dysfunctions. -  She has medical history of osteoporosis, currently on alendronate 70 mg p.o. weekly.  She has taken this medication for approximately 4 years.   Her most recent bone density is from April 2023 showing apparent improvement or stability of her bone density.  No prior history of fragility fractures or falls. No history of  kidney stones.  She reports losing at least 2 inches of height over the years.  No history of CKD.  Her labs also show hypomagnesemia.  she is not on HCTZ or other thiazide therapy.  Is currently on vitamin D supplement, presents with sufficient levels.    she is not on calcium supplements,  she eats dairy and green, leafy, vegetables on average amounts.  she does not have a family history of hypercalcemia, pituitary tumors, thyroid cancer, or osteoporosis.  Her other medical history includes gout, hypertension, controlled type 2 diabetes.   ROS:  Constitutional: + Minimally fluctuating body weight,  no fatigue, no subjective hyperthermia, no subjective hypothermia Eyes: no blurry vision, no xerophthalmia ENT: no sore  throat, no nodules palpated in throat, no dysphagia/odynophagia, no hoarseness   PE: BP 128/80   Pulse 84   Ht 5' 5.5" (1.664 m)   Wt 153 lb 9.6 oz (69.7 kg)   BMI 25.17 kg/m , Body mass index is 25.17 kg/m. Wt Readings from Last 3 Encounters:  08/02/22 153 lb 9.6 oz (69.7 kg)  03/09/22 154 lb (69.9 kg)  01/27/22 153 lb 12.8 oz (69.8 kg)    Constitutional: + BMI of 25.4, not in acute distress, normal state of mind Eyes: PERRLA, EOMI, no exophthalmos ENT: moist mucous membranes, no gross thyromegaly, no gross cervical lymphadenopathy    CMP ( most recent) CMP     Component Value Date/Time   NA 141 11/18/2021 0000   K 4.7 11/18/2021 0000   CL 106 11/18/2021 0000   CO2 22 11/18/2021 0000   GLUCOSE 150 (H) 12/24/2011 1021   BUN 25 (A) 11/18/2021 0000   CREATININE 0.9 11/18/2021 0000   CREATININE 0.73 12/24/2011 1021   CALCIUM 11.5 (A) 11/18/2021 0000   PROT 7.9 12/12/2011 1312   ALBUMIN 4.0 11/18/2021 0000   AST 24 11/18/2021 0000   ALT 18 11/18/2021 0000   ALKPHOS 98 11/18/2021 0000   BILITOT 0.3 12/12/2011 1312   GFRNONAA 83 (L) 12/24/2011 1021   GFRAA >90 12/24/2011 1021    DualFemur Total Right 11/10/2021 83.0 Osteopenia -2.2 0.730 g/cm2 1.1% - DualFemur Total Right 02/14/2019 80.2 Osteopenia -2.3 0.722 g/cm2 -3.0% Yes DualFemur Total Right 06/02/2016 77.5 Osteopenia -2.1 0.744 g/cm2 - -   DualFemur Total Mean 11/10/2021 83.0 Osteopenia -2.0 0.761 g/cm2 0.0% - DualFemur Total Mean 02/14/2019 80.2 Osteopenia -2.0 0.761 g/cm2 1.5% - DualFemur Total Mean 06/02/2016 77.5 - - 0.750 g/cm2 - -   Left Forearm Radius 33% 11/10/2021 83.0 Osteoporosis -2.6 0.529 g/cm2 4.3% - Left Forearm Radius 33% 02/14/2019 80.2 Osteoporosis -2.9 0.508 g/cm2 1.8% - Left Forearm Radius 33% 06/02/2016 77.5 Osteoporosis -3.0 0.499 g/cm2 - -   Her labs on July 28, 2022 showed PTH 137, calcium 10.5, magnesium 1.7  Assessment: 1. Hypercalcemia / Hyperparathyroidism  Plan: I  discussed her new results with her.  Her hyperparathyroidism/hypercalcemia is complicated by osteoporosis.    She will continue to benefit from low-dose Sensipar.  She is advised to continue Sensipar 30 mg p.o. daily at breakfast.   - I discussed with the patient about the physiology of calcium and parathyroid hormone, and possible  effects of  increased PTH/ Calcium , including kidney stones, cardiac dysrhythmias, osteoporosis, abdominal pain, etc.   - The work up so far is consistent with primary hyperparathyroidism . She is not a surgical candidate.      -Her next bone density is not due until April 2025.  Her existing bone density was reviewed.   She is advised to continue her alendronate 70 mg p.o. weekly.  She recalls taking this medication for 4 years at least, we will plan to proceed with alendronate long as she continues to tolerate.     She is advised on fall precautions.  He is advised to maintain vitamin D and avoid calcium supplements for now.  For hypomagnesemia, she was given low-dose magnesium with correction of hypomagnesemia.  She is advised to continue to take until she finishes her current supplies and discontinue.    She is advised to continue follow-up with primary care doctor.  I spent 21 minutes in the care of the patient today including review of labs from Thyroid Function, CMP, and other relevant labs ; imaging/biopsy records (current and previous including abstractions from other facilities); face-to-face time discussing  her lab results and symptoms, medications doses, her options of short and long term treatment based on the latest standards of care / guidelines;   and documenting the encounter.  Tawni Levy  participated in the discussions, expressed understanding, and voiced agreement with the above plans.  All questions were answered to her satisfaction. she is encouraged to contact clinic should she have any questions or concerns prior to her return  visit.   - Return in about 6 months (around 01/31/2023) for F/U with Pre-visit Labs.   Glade Lloyd, MD [facility] [address] [place], [place] [address] Phone: [phone]  Fax: [phone]    This note was partially dictated with voice recognition software. Similar sounding words can be transcribed inadequately or may not  be corrected upon review.  08/02/2022, 11:32 AM

## 2022-09-05 ENCOUNTER — Other Ambulatory Visit: Payer: Self-pay | Admitting: "Endocrinology

## 2022-09-08 ENCOUNTER — Encounter: Payer: Self-pay | Admitting: Obstetrics & Gynecology

## 2022-09-08 ENCOUNTER — Ambulatory Visit: Payer: Medicare HMO | Admitting: Obstetrics & Gynecology

## 2022-09-08 VITALS — BP 141/92 | HR 83 | Ht 65.0 in | Wt 152.2 lb

## 2022-09-08 DIAGNOSIS — Z4689 Encounter for fitting and adjustment of other specified devices: Secondary | ICD-10-CM

## 2022-09-08 DIAGNOSIS — N993 Prolapse of vaginal vault after hysterectomy: Secondary | ICD-10-CM

## 2022-09-08 NOTE — Progress Notes (Signed)
     GYN VISIT Patient name: Leslie Berry MRN [id number]  Date of birth: 1940-12-26 Chief Complaint:    Chief Complaint  Patient presents with   Pessary Check   History of Present Illness:   Leslie Berry is a 82 y.o. PM female being seen today for pessary maintenance.     She uses a Gelhorn pessary.  She self-cleans every month or so.  She has not had any problems with her device.  She reports little vaginal discharge and minmal vaginal bleeding- maybe once every few months notes some light pink spotting   Likert scale(1 not bothersome -5 very bothersome)  :  1  Review of Systems:   Pertinent items are noted in HPI Denies fever/chills, dizziness, headaches, visual disturbances, fatigue, shortness of breath, chest pain, abdominal pain, vomiting, no problems with bowel movements- just dealing with hemorrhoids, urination, or intercourse unless otherwise stated above.  Pertinent History Reviewed:  Reviewed past medical,surgical, social, obstetrical and family history.  Reviewed problem list, medications and allergies. Physical Assessment:   Vitals:   09/08/22 1044 09/08/22 1105  BP: (!) 143/91 (!) 141/92  Pulse: 76 83  Weight: 152 lb 3.2 oz (69 kg)   Height: 5' 5"$  (1.651 m)   Body mass index is 25.33 kg/m.       Physical Examination:   General appearance: alert, well appearing, and in no distress  Psych: mood appropriate, normal affect  Skin: warm & dry   Cardiovascular: normal heart rate noted  Respiratory: normal respiratory effort, no distress  Abdomen: soft, non-tender   GU: normal external genitalia.    Vagina: Exam reveals no undue vaginal mucosal pressure of breakdown, no discharge and no vaginal bleeding.  Vaginal Epithelial Abnormality Classification System:   0 0    No abnormalities 1    Epithelial erythema 2    Granulation tissue 3    Epithelial break or erosion, 1 cm or less 4    Epithelial break or erosion, 1 cm or greater   Extremities: no calf  tenderness bilaterally  Chaperone: Latisha Cresenzo    Assessment & Plan:     ICD-10-CM   1. Vaginal vault prolapse after hysterectomy: Gelhorn 2.25 inch  N99.3     2. Pessary maintenance  Z46.89       Doing well with pessary Encouraged pt to bring pessary to appointment for full evaluation -f/u prn or yearly   Return in about 1 year (around 09/09/2023) for annual pessary check- Taggart Prasad or [facility].   Janyth Pupa, DO Attending [place], [facility] for [employer], [place]

## 2022-09-25 DIAGNOSIS — Z961 Presence of intraocular lens: Secondary | ICD-10-CM | POA: Diagnosis not present

## 2022-09-25 DIAGNOSIS — H524 Presbyopia: Secondary | ICD-10-CM | POA: Diagnosis not present

## 2022-09-25 DIAGNOSIS — E119 Type 2 diabetes mellitus without complications: Secondary | ICD-10-CM | POA: Diagnosis not present

## 2022-10-03 DIAGNOSIS — M79674 Pain in right toe(s): Secondary | ICD-10-CM | POA: Diagnosis not present

## 2022-10-03 DIAGNOSIS — M2011 Hallux valgus (acquired), right foot: Secondary | ICD-10-CM | POA: Diagnosis not present

## 2022-10-06 DIAGNOSIS — M810 Age-related osteoporosis without current pathological fracture: Secondary | ICD-10-CM | POA: Diagnosis not present

## 2022-10-06 DIAGNOSIS — M109 Gout, unspecified: Secondary | ICD-10-CM | POA: Diagnosis not present

## 2022-10-06 DIAGNOSIS — I10 Essential (primary) hypertension: Secondary | ICD-10-CM | POA: Diagnosis not present

## 2022-10-06 DIAGNOSIS — E1129 Type 2 diabetes mellitus with other diabetic kidney complication: Secondary | ICD-10-CM | POA: Diagnosis not present

## 2022-10-12 DIAGNOSIS — E1129 Type 2 diabetes mellitus with other diabetic kidney complication: Secondary | ICD-10-CM | POA: Diagnosis not present

## 2022-10-12 DIAGNOSIS — M109 Gout, unspecified: Secondary | ICD-10-CM | POA: Diagnosis not present

## 2022-10-12 DIAGNOSIS — K219 Gastro-esophageal reflux disease without esophagitis: Secondary | ICD-10-CM | POA: Diagnosis not present

## 2022-10-12 DIAGNOSIS — K7460 Unspecified cirrhosis of liver: Secondary | ICD-10-CM | POA: Diagnosis not present

## 2022-10-12 DIAGNOSIS — M545 Low back pain, unspecified: Secondary | ICD-10-CM | POA: Diagnosis not present

## 2022-10-12 DIAGNOSIS — N3281 Overactive bladder: Secondary | ICD-10-CM | POA: Diagnosis not present

## 2022-10-12 DIAGNOSIS — R002 Palpitations: Secondary | ICD-10-CM | POA: Diagnosis not present

## 2022-10-12 DIAGNOSIS — I10 Essential (primary) hypertension: Secondary | ICD-10-CM | POA: Diagnosis not present

## 2022-10-12 DIAGNOSIS — E785 Hyperlipidemia, unspecified: Secondary | ICD-10-CM | POA: Diagnosis not present

## 2022-10-12 DIAGNOSIS — E213 Hyperparathyroidism, unspecified: Secondary | ICD-10-CM | POA: Diagnosis not present

## 2022-10-12 DIAGNOSIS — M810 Age-related osteoporosis without current pathological fracture: Secondary | ICD-10-CM | POA: Diagnosis not present

## 2022-10-12 DIAGNOSIS — R809 Proteinuria, unspecified: Secondary | ICD-10-CM | POA: Diagnosis not present

## 2022-10-26 DIAGNOSIS — I10 Essential (primary) hypertension: Secondary | ICD-10-CM | POA: Diagnosis not present

## 2022-10-26 DIAGNOSIS — Z87891 Personal history of nicotine dependence: Secondary | ICD-10-CM | POA: Diagnosis not present

## 2022-10-26 DIAGNOSIS — Z79899 Other long term (current) drug therapy: Secondary | ICD-10-CM | POA: Diagnosis not present

## 2022-11-09 DIAGNOSIS — I129 Hypertensive chronic kidney disease with stage 1 through stage 4 chronic kidney disease, or unspecified chronic kidney disease: Secondary | ICD-10-CM | POA: Diagnosis not present

## 2022-11-09 DIAGNOSIS — I499 Cardiac arrhythmia, unspecified: Secondary | ICD-10-CM | POA: Diagnosis not present

## 2022-11-09 DIAGNOSIS — M1990 Unspecified osteoarthritis, unspecified site: Secondary | ICD-10-CM | POA: Diagnosis not present

## 2022-11-09 DIAGNOSIS — N182 Chronic kidney disease, stage 2 (mild): Secondary | ICD-10-CM | POA: Diagnosis not present

## 2022-11-09 DIAGNOSIS — Z8249 Family history of ischemic heart disease and other diseases of the circulatory system: Secondary | ICD-10-CM | POA: Diagnosis not present

## 2022-11-09 DIAGNOSIS — R011 Cardiac murmur, unspecified: Secondary | ICD-10-CM | POA: Diagnosis not present

## 2022-11-09 DIAGNOSIS — Z809 Family history of malignant neoplasm, unspecified: Secondary | ICD-10-CM | POA: Diagnosis not present

## 2022-11-09 DIAGNOSIS — E785 Hyperlipidemia, unspecified: Secondary | ICD-10-CM | POA: Diagnosis not present

## 2022-11-09 DIAGNOSIS — J309 Allergic rhinitis, unspecified: Secondary | ICD-10-CM | POA: Diagnosis not present

## 2022-11-09 DIAGNOSIS — Z7984 Long term (current) use of oral hypoglycemic drugs: Secondary | ICD-10-CM | POA: Diagnosis not present

## 2022-11-09 DIAGNOSIS — K219 Gastro-esophageal reflux disease without esophagitis: Secondary | ICD-10-CM | POA: Diagnosis not present

## 2022-11-09 DIAGNOSIS — Z87891 Personal history of nicotine dependence: Secondary | ICD-10-CM | POA: Diagnosis not present

## 2022-11-16 ENCOUNTER — Other Ambulatory Visit (HOSPITAL_COMMUNITY): Payer: Self-pay | Admitting: Internal Medicine

## 2022-11-16 DIAGNOSIS — Z1231 Encounter for screening mammogram for malignant neoplasm of breast: Secondary | ICD-10-CM

## 2022-11-30 ENCOUNTER — Ambulatory Visit (HOSPITAL_COMMUNITY)
Admission: RE | Admit: 2022-11-30 | Discharge: 2022-11-30 | Disposition: A | Discharge status: Home / Self Care | Payer: Medicare HMO | Source: Ambulatory Visit | Attending: Internal Medicine | Admitting: Internal Medicine

## 2022-11-30 DIAGNOSIS — Z1231 Encounter for screening mammogram for malignant neoplasm of breast: Secondary | ICD-10-CM | POA: Insufficient documentation

## 2023-01-17 DIAGNOSIS — M810 Age-related osteoporosis without current pathological fracture: Secondary | ICD-10-CM | POA: Diagnosis not present

## 2023-01-17 DIAGNOSIS — M109 Gout, unspecified: Secondary | ICD-10-CM | POA: Diagnosis not present

## 2023-01-17 DIAGNOSIS — E1129 Type 2 diabetes mellitus with other diabetic kidney complication: Secondary | ICD-10-CM | POA: Diagnosis not present

## 2023-01-17 DIAGNOSIS — I10 Essential (primary) hypertension: Secondary | ICD-10-CM | POA: Diagnosis not present

## 2023-01-23 DIAGNOSIS — M545 Low back pain, unspecified: Secondary | ICD-10-CM | POA: Diagnosis not present

## 2023-01-23 DIAGNOSIS — R002 Palpitations: Secondary | ICD-10-CM | POA: Diagnosis not present

## 2023-01-23 DIAGNOSIS — M810 Age-related osteoporosis without current pathological fracture: Secondary | ICD-10-CM | POA: Diagnosis not present

## 2023-01-23 DIAGNOSIS — K219 Gastro-esophageal reflux disease without esophagitis: Secondary | ICD-10-CM | POA: Diagnosis not present

## 2023-01-23 DIAGNOSIS — N3281 Overactive bladder: Secondary | ICD-10-CM | POA: Diagnosis not present

## 2023-01-23 DIAGNOSIS — K7460 Unspecified cirrhosis of liver: Secondary | ICD-10-CM | POA: Diagnosis not present

## 2023-01-23 DIAGNOSIS — I10 Essential (primary) hypertension: Secondary | ICD-10-CM | POA: Diagnosis not present

## 2023-01-23 DIAGNOSIS — E1129 Type 2 diabetes mellitus with other diabetic kidney complication: Secondary | ICD-10-CM | POA: Diagnosis not present

## 2023-01-23 DIAGNOSIS — E213 Hyperparathyroidism, unspecified: Secondary | ICD-10-CM | POA: Diagnosis not present

## 2023-01-23 DIAGNOSIS — M109 Gout, unspecified: Secondary | ICD-10-CM | POA: Diagnosis not present

## 2023-01-30 ENCOUNTER — Other Ambulatory Visit: Payer: Self-pay

## 2023-01-30 DIAGNOSIS — E210 Primary hyperparathyroidism: Secondary | ICD-10-CM

## 2023-01-31 ENCOUNTER — Ambulatory Visit: Payer: Medicare HMO | Admitting: "Endocrinology

## 2023-02-07 DIAGNOSIS — H52222 Regular astigmatism, left eye: Secondary | ICD-10-CM | POA: Diagnosis not present

## 2023-02-07 DIAGNOSIS — H524 Presbyopia: Secondary | ICD-10-CM | POA: Diagnosis not present

## 2023-03-05 DIAGNOSIS — E210 Primary hyperparathyroidism: Secondary | ICD-10-CM | POA: Diagnosis not present

## 2023-03-13 ENCOUNTER — Encounter: Payer: Self-pay | Admitting: "Endocrinology

## 2023-03-13 ENCOUNTER — Ambulatory Visit: Payer: Medicare HMO | Admitting: "Endocrinology

## 2023-03-13 VITALS — BP 126/74 | HR 68 | Ht 65.0 in | Wt 147.4 lb

## 2023-03-13 DIAGNOSIS — E210 Primary hyperparathyroidism: Secondary | ICD-10-CM | POA: Diagnosis not present

## 2023-03-13 DIAGNOSIS — M810 Age-related osteoporosis without current pathological fracture: Secondary | ICD-10-CM

## 2023-03-13 NOTE — Progress Notes (Signed)
03/13/2023, 4:08 PM  Endocrinology follow-up note  Leslie Berry is a 82 y.o.-year-old female, referred by her  Benita Stabile, MD  , for evaluation for hypercalcemia/hyperparathyroidism.   Past Medical History:  Diagnosis Date   Arthritis    Diabetes mellitus    Elevated LFTs    GERD (gastroesophageal reflux disease)    Gout    Hypertension    dr bland  pcp   Osteoporosis     Past Surgical History:  Procedure Laterality Date   CARPAL TUNNEL RELEASE     CARPAL TUNNEL RELEASE  12/22/2011   Procedure: CARPAL TUNNEL RELEASE;  Surgeon: Kennieth Rad, MD;  Location: [facility];  Service: Orthopedics;  Laterality: Right;   CESAREAN SECTION     ROTATOR CUFF REPAIR      Social History   Tobacco Use   Smoking status: Former    Current packs/day: 0.00    Types: Cigarettes    Quit date: 05/15/1983    Years since quitting: 39.8   Smokeless tobacco: Never  Vaping Use   Vaping status: Never Used  Substance Use Topics   Alcohol use: No   Drug use: No    Family History  Problem Relation Age of Onset   Thyroid disease Mother    Hypertension Mother    Hyperlipidemia Mother    Stroke Father    Cancer Sister    Hypertension Maternal Grandmother    Hypertension Maternal Grandfather    Anesthesia problems Neg Hx     Outpatient Encounter Medications as of 03/13/2023  Medication Sig   FARXIGA 10 MG TABS tablet Take 10 mg by mouth daily.   acetaminophen (TYLENOL) 500 MG tablet Take 1,000 mg by mouth every 12 (twelve) hours as needed. Arthritis   alendronate (FOSAMAX) 70 MG tablet Take 70 mg by mouth once a week.   allopurinol (ZYLOPRIM) 100 MG tablet Take 100 mg by mouth daily.   amLODipine (NORVASC) 10 MG tablet Take 10 mg by mouth daily.   aspirin EC 81 MG tablet Take 81 mg by mouth daily. Swallow whole.   cinacalcet (SENSIPAR) 30 MG tablet TAKE 1 TABLET (30 MG TOTAL) BY MOUTH DAILY WITH BREAKFAST.   colchicine 0.6  MG tablet Take 0.6 mg by mouth daily.   Magnesium 250 MG TABS Take 1 tablet (250 mg total) by mouth daily with breakfast.   omeprazole (PRILOSEC) 20 MG capsule Take 20 mg by mouth daily.   ursodiol (ACTIGALL) 500 MG tablet Take 500 mg by mouth in the morning and at bedtime.    [DISCONTINUED] metFORMIN (GLUCOPHAGE) 500 MG tablet Take 500 mg by mouth daily.   No facility-administered encounter medications on file as of 03/13/2023.    No Known Allergies   HPI  Leslie Berry was diagnosed with hypercalcemia 2 to 3 years ago.   She was seen for significant hypercalcemia associated with high PTH consistent with primary hyperparathyroidism.  She was started on and responding to low-dose Sensipar 30 mg p.o. daily at breakfast.  She presents with controlled calcium at 10.3, stable high PTH of 173.    Patient has no  previously known history of pituitary, adrenal dysfunctions; no family history of such dysfunctions. -She has medical history of osteoporosis, currently on alendronate 70 mg p.o. weekly.  She continues to tolerate Fosamax. She has taken this medication for approximately 4 years.   Her most recent bone density is from April 2023 showing apparent improvement or stability of her bone density.  No prior history of fragility fractures or falls. No history of  kidney stones.  She reports losing at least 2 inches of height over the years.  No history of CKD.  Her labs also show hypomagnesemia.  she is not on HCTZ or other thiazide therapy.  Is currently on vitamin D supplement, presents with sufficient levels.    she is not on calcium supplements,  she eats dairy and green, leafy, vegetables on average amounts.  she does not have a family history of hypercalcemia, pituitary tumors, thyroid cancer, or osteoporosis.  Her other medical history includes gout, hypertension, controlled type 2 diabetes.   ROS:  Constitutional: + Minimally fluctuating body weight,  no fatigue, no subjective  hyperthermia, no subjective hypothermia Eyes: no blurry vision, no xerophthalmia ENT: no sore throat, no nodules palpated in throat, no dysphagia/odynophagia, no hoarseness   PE: BP 126/74   Pulse 68   Ht 5\' 5"  (1.651 m)   Wt 147 lb 6.4 oz (66.9 kg)   BMI 24.53 kg/m , Body mass index is 24.53 kg/m. Wt Readings from Last 3 Encounters:  03/13/23 147 lb 6.4 oz (66.9 kg)  09/08/22 152 lb 3.2 oz (69 kg)  08/02/22 153 lb 9.6 oz (69.7 kg)    Constitutional: + BMI of 25.4, not in acute distress, normal state of mind Eyes: PERRLA, EOMI, no exophthalmos ENT: moist mucous membranes, no gross thyromegaly, no gross cervical lymphadenopathy    CMP ( most recent) CMP     Component Value Date/Time   NA 143 03/05/2023 0909   K 4.4 03/05/2023 0909   CL 106 03/05/2023 0909   CO2 22 03/05/2023 0909   GLUCOSE 81 03/05/2023 0909   GLUCOSE 150 (H) 12/24/2011 1021   BUN 20 03/05/2023 0909   CREATININE 0.99 03/05/2023 0909   CALCIUM 10.3 03/05/2023 0909   PROT 7.1 03/05/2023 0909   ALBUMIN 4.1 03/05/2023 0909   AST 24 03/05/2023 0909   ALT 20 03/05/2023 0909   ALKPHOS 109 03/05/2023 0909   BILITOT 0.3 03/05/2023 0909   GFRNONAA 83 (L) 12/24/2011 1021   GFRAA >90 12/24/2011 1021    DualFemur Total Right 11/10/2021 83.0 Osteopenia -2.2 0.730 g/cm2 1.1% - DualFemur Total Right 02/14/2019 80.2 Osteopenia -2.3 0.722 g/cm2 -3.0% Yes DualFemur Total Right 06/02/2016 77.5 Osteopenia -2.1 0.744 g/cm2 - -   DualFemur Total Mean 11/10/2021 83.0 Osteopenia -2.0 0.761 g/cm2 0.0% - DualFemur Total Mean 02/14/2019 80.2 Osteopenia -2.0 0.761 g/cm2 1.5% - DualFemur Total Mean 06/02/2016 77.5 - - 0.750 g/cm2 - -   Left Forearm Radius 33% 11/10/2021 83.0 Osteoporosis -2.6 0.529 g/cm2 4.3% - Left Forearm Radius 33% 02/14/2019 80.2 Osteoporosis -2.9 0.508 g/cm2 1.8% - Left Forearm Radius 33% 06/02/2016 77.5 Osteoporosis -3.0 0.499 g/cm2 - -   Her labs on July 28, 2022 showed PTH 137, calcium  10.5, magnesium 1.7  Assessment: 1. Hypercalcemia / Hyperparathyroidism  Plan: I discussed her new results with her.  Her hyperparathyroidism/hypercalcemia is complicated by osteoporosis.  She continues to benefit from low-dose Sensipar.  I advised her to continue Sensipar 30 mg p.o. daily at breakfast.  - I discussed with the  patient about the physiology of calcium and parathyroid hormone, and possible  effects of  increased PTH/ Calcium , including kidney stones, cardiac dysrhythmias, osteoporosis, abdominal pain, etc.   - The work up so far is consistent with primary hyperparathyroidism . She is not a surgical candidate.      -Her next bone density is not due until April 2025.  Her existing bone density was reviewed.   She is advised to continue her alendronate 70 mg p.o. weekly.  She recalls taking this medication for 4 years at least, we will plan to proceed with alendronate long as she continues to tolerate.     She is advised on fall precautions.  He is advised to maintain vitamin D and avoid calcium supplements for now. Her next bone density is in May 2025.    She is advised to continue follow-up with primary care doctor.  I spent  22  minutes in the care of the patient today including review of labs from Thyroid Function, CMP, and other relevant labs ; imaging/biopsy records (current and previous including abstractions from other facilities); face-to-face time discussing  her lab results and symptoms, medications doses, her options of short and long term treatment based on the latest standards of care / guidelines;   and documenting the encounter.  Dow Adolph  participated in the discussions, expressed understanding, and voiced agreement with the above plans.  All questions were answered to her satisfaction. she is encouraged to contact clinic should she have any questions or concerns prior to her return visit.   - Return in about 6 months (around 09/13/2023) for F/U with  Pre-visit Labs.   Marquis Lunch, MD [facility] [address] [place], [place] [address] Phone: [phone]  Fax: [phone]    This note was partially dictated with voice recognition software. Similar sounding words can be transcribed inadequately or may not  be corrected upon review.  03/13/2023, 4:08 PM

## 2023-04-15 ENCOUNTER — Encounter: Payer: Self-pay | Admitting: Emergency Medicine

## 2023-04-15 ENCOUNTER — Ambulatory Visit
Admission: EM | Admit: 2023-04-15 | Discharge: 2023-04-15 | Disposition: A | Discharge status: Home / Self Care | Payer: Medicare HMO | Attending: Family Medicine | Admitting: Family Medicine

## 2023-04-15 ENCOUNTER — Other Ambulatory Visit: Payer: Self-pay

## 2023-04-15 DIAGNOSIS — Z20822 Contact with and (suspected) exposure to covid-19: Secondary | ICD-10-CM | POA: Insufficient documentation

## 2023-04-15 DIAGNOSIS — J069 Acute upper respiratory infection, unspecified: Secondary | ICD-10-CM | POA: Diagnosis not present

## 2023-04-15 MED ORDER — BENZONATATE 100 MG PO CAPS: 100.0000 mg | ORAL_CAPSULE | Freq: Three times a day (TID) | ORAL | 0 refills | Status: DC

## 2023-04-15 NOTE — Discharge Instructions (Signed)
Your COVID test should be back tomorrow and someone will call if your results are positive to discuss antiviral options.  You may take Coricidin HBP, Flonase, plain Mucinex, Tylenol and I have sent over some cough medicine for you

## 2023-04-15 NOTE — ED Triage Notes (Signed)
Pt reports runny nose, bilateral eyes "watery" x2 days. Has had covid exposure.

## 2023-04-15 NOTE — ED Provider Notes (Signed)
[facility]    CSN: [id number] Arrival date & time: 04/15/23  0948      History   Chief Complaint Chief Complaint  Patient presents with   Nasal Congestion    HPI Leslie Berry is a 82 y.o. female.   Patient presenting today with 2-day history of runny nose, watery eyes, cough, low-grade fever.  Denies chest pain, shortness of breath, abdominal pain, nausea vomiting or diarrhea.  So far not trying anything over-the-counter other than allergy pills.  Multiple sick contacts with COVID at home.    Past Medical History:  Diagnosis Date   Arthritis    Diabetes mellitus    Elevated LFTs    GERD (gastroesophageal reflux disease)    Gout    Hypertension    dr bland  pcp   Osteoporosis     Patient Active Problem List   Diagnosis Date Noted   Palpitations 01/27/2022   Premature atrial contractions 01/27/2022   Systolic murmur 01/27/2022   Hypercalcemia 12/07/2021   Hyperparathyroidism, primary (HCC) 12/07/2021   Age-related osteoporosis without current pathological fracture 12/07/2021   Senile atrophic vaginitis 10/03/2016   Vaginal vault prolapse after hysterectomy 06/04/2014   Left leg weakness 05/30/2011    Past Surgical History:  Procedure Laterality Date   CARPAL TUNNEL RELEASE     CARPAL TUNNEL RELEASE  12/22/2011   Procedure: CARPAL TUNNEL RELEASE;  Surgeon: Kennieth Rad, MD;  Location: [facility] OR;  Service: Orthopedics;  Laterality: Right;   CESAREAN SECTION     ROTATOR CUFF REPAIR      OB History     Gravida  4   Para      Term      Preterm      AB      Living  4      SAB      IAB      Ectopic      Multiple      Live Births               Home Medications    Prior to Admission medications   Medication Sig Start Date End Date Taking? Authorizing Provider  benzonatate (TESSALON) 100 MG capsule Take 1 capsule (100 mg total) by mouth every 8 (eight) hours. 04/15/23  Yes Particia Nearing, PA-C  acetaminophen  (TYLENOL) 500 MG tablet Take 1,000 mg by mouth every 12 (twelve) hours as needed. Arthritis    [provider]  alendronate (FOSAMAX) 70 MG tablet Take 70 mg by mouth once a week. 11/05/21   [provider]  allopurinol (ZYLOPRIM) 100 MG tablet Take 100 mg by mouth daily. 09/08/21   [provider]  amLODipine (NORVASC) 10 MG tablet Take 10 mg by mouth daily.    [provider]  aspirin EC 81 MG tablet Take 81 mg by mouth daily. Swallow whole.    [provider]  cinacalcet (SENSIPAR) 30 MG tablet TAKE 1 TABLET (30 MG TOTAL) BY MOUTH DAILY WITH BREAKFAST. 09/05/22   Roma Kayser, MD  colchicine 0.6 MG tablet Take 0.6 mg by mouth daily. 10/19/21   [provider]  FARXIGA 10 MG TABS tablet Take 10 mg by mouth daily. 03/02/23   [provider]  Magnesium 250 MG TABS Take 1 tablet (250 mg total) by mouth daily with breakfast. 03/09/22   Roma Kayser, MD  omeprazole (PRILOSEC) 20 MG capsule Take 20 mg by mouth daily.    [provider]  ursodiol (ACTIGALL) 500 MG tablet Take 500 mg by mouth in the morning and at bedtime.  08/04/16   [provider]    Family History Family History  Problem Relation Age of Onset   Thyroid disease Mother    Hypertension Mother    Hyperlipidemia Mother    Stroke Father    Cancer Sister    Hypertension Maternal Grandmother    Hypertension Maternal Grandfather    Anesthesia problems Neg Hx     Social History Social History   Tobacco Use   Smoking status: Former    Current packs/day: 0.00    Types: Cigarettes    Quit date: 05/15/1983    Years since quitting: 39.9   Smokeless tobacco: Never  Vaping Use   Vaping status: Never Used  Substance Use Topics   Alcohol use: No   Drug use: No     Allergies   Patient has no known allergies.   Review of Systems Review of Systems PER HPI  Physical Exam Triage Vital Signs ED Triage Vitals  Encounter Vitals Group      BP 04/15/23 1128 (!) 143/91     Systolic BP Percentile --      Diastolic BP Percentile --      Pulse Rate 04/15/23 1128 88     Resp 04/15/23 1128 20     Temp 04/15/23 1128 99.7 F (37.6 C)     Temp Source 04/15/23 1128 Oral     SpO2 04/15/23 1128 93 %     Weight --      Height --      Head Circumference --      Peak Flow --      Pain Score 04/15/23 1127 0     Pain Loc --      Pain Education --      Exclude from Growth Chart --    No data found.  Updated Vital Signs BP (!) 143/91 (BP Location: Right Arm)   Pulse 88   Temp 99.7 F (37.6 C) (Oral)   Resp 20   SpO2 93%   Visual Acuity Right Eye Distance:   Left Eye Distance:   Bilateral Distance:    Right Eye Near:   Left Eye Near:    Bilateral Near:     Physical Exam Vitals and nursing note reviewed.  Constitutional:      Appearance: Normal appearance. She is not ill-appearing.  HENT:     Head: Atraumatic.     Right Ear: Tympanic membrane and external ear normal.     Left Ear: Tympanic membrane and external ear normal.     Nose: Rhinorrhea present.     Mouth/Throat:     Mouth: Mucous membranes are moist.     Pharynx: Posterior oropharyngeal erythema present.  Eyes:     Extraocular Movements: Extraocular movements intact.     Conjunctiva/sclera: Conjunctivae normal.  Cardiovascular:     Rate and Rhythm: Normal rate and regular rhythm.     Heart sounds: Normal heart sounds.  Pulmonary:     Effort: Pulmonary effort is normal.     Breath sounds: Normal breath sounds. No wheezing or rales.  Musculoskeletal:        General: Normal range of motion.     Cervical back: Normal range of motion and neck supple.  Skin:    General: Skin is warm and dry.  Neurological:     Mental Status: She is alert and oriented to person, place, and time.  Psychiatric:        Mood and Affect: Mood normal.        Thought Content: Thought content normal.        Judgment: Judgment normal.    UC Treatments / Results   Labs (all labs ordered are listed, but only abnormal results are displayed) Labs Reviewed  SARS CORONAVIRUS 2 (TAT 6-24 HRS)    EKG   Radiology No results found.  Procedures Procedures (including critical care time)  Medications Ordered in UC Medications - No data to display  Initial Impression / Assessment and Plan / UC Course  I have reviewed the triage vital signs and the nursing notes.  Pertinent labs & imaging results that were available during my care of the patient were reviewed by me and considered in my medical decision making (see chart for details).     Vitals and exam overall reassuring today, COVID testing pending, suspicious for this given home exposures and symptoms.  Discussed Tessalon Perles, safe over-the-counter cold and congestion medications and home care.  Return for worsening symptoms.  Good candidate for Paxlovid if positive for COVID. Final Clinical Impressions(s) / UC Diagnoses   Final diagnoses:  Exposure to COVID-19 virus  Viral URI with cough     Discharge Instructions      Your COVID test should be back tomorrow and someone will call if your results are positive to discuss antiviral options.  You may take Coricidin HBP, Flonase, plain Mucinex, Tylenol and I have sent over some cough medicine for you    ED Prescriptions     Medication Sig Dispense Auth. Provider   benzonatate (TESSALON) 100 MG capsule Take 1 capsule (100 mg total) by mouth every 8 (eight) hours. 21 capsule Particia Nearing, [place]      PDMP not reviewed this encounter.   Particia Nearing, [place] 04/15/23 1231

## 2023-04-16 ENCOUNTER — Telehealth: Payer: Self-pay

## 2023-04-16 LAB — SARS CORONAVIRUS 2 (TAT 6-24 HRS): SARS Coronavirus 2: POSITIVE — AB

## 2023-04-16 MED ORDER — PAXLOVID (300/100) 20 X 150 MG & 10 X 100MG PO TBPK: 3.0000 | ORAL_TABLET | Freq: Two times a day (BID) | ORAL | 0 refills | Status: AC

## 2023-04-16 NOTE — Telephone Encounter (Signed)
Pt called wanting lab results, for COVID. Test was positive patient informed. Pt verbalized understanding, states she is feeling fine still has nasal drainage.   Provider stated in notes that pt is a good candidate to receive Paxlovid. Provider has given verbal permission to send in Rx.

## 2023-04-26 ENCOUNTER — Other Ambulatory Visit: Payer: Self-pay | Admitting: "Endocrinology

## 2023-04-26 DIAGNOSIS — K219 Gastro-esophageal reflux disease without esophagitis: Secondary | ICD-10-CM | POA: Diagnosis not present

## 2023-04-26 DIAGNOSIS — K743 Primary biliary cirrhosis: Secondary | ICD-10-CM | POA: Diagnosis not present

## 2023-04-26 DIAGNOSIS — K5730 Diverticulosis of large intestine without perforation or abscess without bleeding: Secondary | ICD-10-CM | POA: Diagnosis not present

## 2023-05-09 DIAGNOSIS — R809 Proteinuria, unspecified: Secondary | ICD-10-CM | POA: Diagnosis not present

## 2023-05-09 DIAGNOSIS — M810 Age-related osteoporosis without current pathological fracture: Secondary | ICD-10-CM | POA: Diagnosis not present

## 2023-05-09 DIAGNOSIS — I10 Essential (primary) hypertension: Secondary | ICD-10-CM | POA: Diagnosis not present

## 2023-05-09 DIAGNOSIS — E1129 Type 2 diabetes mellitus with other diabetic kidney complication: Secondary | ICD-10-CM | POA: Diagnosis not present

## 2023-05-09 DIAGNOSIS — M109 Gout, unspecified: Secondary | ICD-10-CM | POA: Diagnosis not present

## 2023-05-10 DIAGNOSIS — R809 Proteinuria, unspecified: Secondary | ICD-10-CM | POA: Diagnosis not present

## 2023-05-14 DIAGNOSIS — M545 Low back pain, unspecified: Secondary | ICD-10-CM | POA: Diagnosis not present

## 2023-05-14 DIAGNOSIS — K219 Gastro-esophageal reflux disease without esophagitis: Secondary | ICD-10-CM | POA: Diagnosis not present

## 2023-05-14 DIAGNOSIS — R002 Palpitations: Secondary | ICD-10-CM | POA: Diagnosis not present

## 2023-05-14 DIAGNOSIS — E213 Hyperparathyroidism, unspecified: Secondary | ICD-10-CM | POA: Diagnosis not present

## 2023-05-14 DIAGNOSIS — Z23 Encounter for immunization: Secondary | ICD-10-CM | POA: Diagnosis not present

## 2023-05-14 DIAGNOSIS — N3281 Overactive bladder: Secondary | ICD-10-CM | POA: Diagnosis not present

## 2023-05-14 DIAGNOSIS — K7460 Unspecified cirrhosis of liver: Secondary | ICD-10-CM | POA: Diagnosis not present

## 2023-05-14 DIAGNOSIS — M109 Gout, unspecified: Secondary | ICD-10-CM | POA: Diagnosis not present

## 2023-05-14 DIAGNOSIS — M810 Age-related osteoporosis without current pathological fracture: Secondary | ICD-10-CM | POA: Diagnosis not present

## 2023-05-14 DIAGNOSIS — I10 Essential (primary) hypertension: Secondary | ICD-10-CM | POA: Diagnosis not present

## 2023-05-14 DIAGNOSIS — R809 Proteinuria, unspecified: Secondary | ICD-10-CM | POA: Diagnosis not present

## 2023-05-14 DIAGNOSIS — E1129 Type 2 diabetes mellitus with other diabetic kidney complication: Secondary | ICD-10-CM | POA: Diagnosis not present

## 2023-08-15 ENCOUNTER — Encounter (HOSPITAL_COMMUNITY): Payer: Self-pay | Admitting: Family Medicine

## 2023-08-16 ENCOUNTER — Other Ambulatory Visit (HOSPITAL_COMMUNITY): Payer: Self-pay | Admitting: Family Medicine

## 2023-08-16 DIAGNOSIS — M25531 Pain in right wrist: Secondary | ICD-10-CM

## 2023-08-21 ENCOUNTER — Ambulatory Visit (HOSPITAL_COMMUNITY)
Admission: RE | Admit: 2023-08-21 | Discharge: 2023-08-21 | Disposition: A | Discharge status: Home / Self Care | Payer: PPO | Source: Ambulatory Visit | Attending: Family Medicine | Admitting: Family Medicine

## 2023-08-21 DIAGNOSIS — M25531 Pain in right wrist: Secondary | ICD-10-CM | POA: Insufficient documentation

## 2023-09-07 DIAGNOSIS — E210 Primary hyperparathyroidism: Secondary | ICD-10-CM | POA: Diagnosis not present

## 2023-09-08 LAB — COMPREHENSIVE METABOLIC PANEL
ALT: 16 [IU]/L (ref 0–32)
AST: 22 [IU]/L (ref 0–40)
Albumin: 3.9 g/dL (ref 3.7–4.7)
Alkaline Phosphatase: 117 [IU]/L (ref 44–121)
BUN/Creatinine Ratio: 18 (ref 12–28)
BUN: 16 mg/dL (ref 8–27)
Bilirubin Total: 0.5 mg/dL (ref 0.0–1.2)
CO2: 23 mmol/L (ref 20–29)
Calcium: 11.4 mg/dL — ABNORMAL HIGH (ref 8.7–10.3)
Chloride: 105 mmol/L (ref 96–106)
Creatinine, Ser: 0.91 mg/dL (ref 0.57–1.00)
Globulin, Total: 3.4 g/dL (ref 1.5–4.5)
Glucose: 77 mg/dL (ref 70–99)
Potassium: 4.4 mmol/L (ref 3.5–5.2)
Sodium: 141 mmol/L (ref 134–144)
Total Protein: 7.3 g/dL (ref 6.0–8.5)
eGFR: 62 mL/min/{1.73_m2} (ref >59)

## 2023-09-08 LAB — PTH, INTACT AND CALCIUM: PTH: 90 pg/mL — ABNORMAL HIGH (ref 15–65)

## 2023-09-13 ENCOUNTER — Ambulatory Visit: Payer: PPO | Admitting: "Endocrinology

## 2023-09-13 ENCOUNTER — Telehealth: Payer: Self-pay | Admitting: "Endocrinology

## 2023-09-13 ENCOUNTER — Encounter: Payer: Self-pay | Admitting: "Endocrinology

## 2023-09-13 ENCOUNTER — Telehealth: Payer: Self-pay

## 2023-09-13 ENCOUNTER — Other Ambulatory Visit (HOSPITAL_COMMUNITY): Payer: Self-pay

## 2023-09-13 VITALS — BP 132/70 | HR 76 | Ht 65.0 in | Wt 144.4 lb

## 2023-09-13 DIAGNOSIS — E210 Primary hyperparathyroidism: Secondary | ICD-10-CM

## 2023-09-13 DIAGNOSIS — M810 Age-related osteoporosis without current pathological fracture: Secondary | ICD-10-CM | POA: Diagnosis not present

## 2023-09-13 MED ORDER — CINACALCET HCL 30 MG PO TABS: 30.0000 mg | ORAL_TABLET | Freq: Two times a day (BID) | ORAL | 1 refills | Status: DC

## 2023-09-13 NOTE — Telephone Encounter (Signed)
Order faxed to [facility].

## 2023-09-13 NOTE — Telephone Encounter (Signed)
Pt needs a DEXA before next visit. Please send order to [facility] via fax

## 2023-09-13 NOTE — Progress Notes (Signed)
09/13/2023, 10:50 AM  Endocrinology follow-up note  Leslie Berry is a 83 y.o.-year-old female, referred by her  Leslie Stabile, MD  , for evaluation for hypercalcemia/hyperparathyroidism.   Past Medical History:  Diagnosis Date   Arthritis    Diabetes mellitus    Elevated LFTs    GERD (gastroesophageal reflux disease)    Gout    Hypertension    dr bland  pcp   Osteoporosis     Past Surgical History:  Procedure Laterality Date   CARPAL TUNNEL RELEASE     CARPAL TUNNEL RELEASE  12/22/2011   Procedure: CARPAL TUNNEL RELEASE;  Surgeon: Kennieth Rad, MD;  Location: [facility] OR;  Service: Orthopedics;  Laterality: Right;   CESAREAN SECTION     ROTATOR CUFF REPAIR      Social History   Tobacco Use   Smoking status: Former    Current packs/day: 0.00    Types: Cigarettes    Quit date: 05/15/1983    Years since quitting: 40.3   Smokeless tobacco: Never  Vaping Use   Vaping status: Never Used  Substance Use Topics   Alcohol use: No   Drug use: No    Family History  Problem Relation Age of Onset   Thyroid disease Mother    Hypertension Mother    Hyperlipidemia Mother    Stroke Father    Cancer Sister    Hypertension Maternal Grandmother    Hypertension Maternal Grandfather    Anesthesia problems Neg Hx     Outpatient Encounter Medications as of 09/13/2023  Medication Sig   acetaminophen (TYLENOL) 500 MG tablet Take 1,000 mg by mouth every 12 (twelve) hours as needed. Arthritis   alendronate (FOSAMAX) 70 MG tablet Take 70 mg by mouth once a week.   allopurinol (ZYLOPRIM) 100 MG tablet Take 100 mg by mouth daily.   amLODipine (NORVASC) 10 MG tablet Take 10 mg by mouth daily.   aspirin EC 81 MG tablet Take 81 mg by mouth daily. Swallow whole.   benzonatate (TESSALON) 100 MG capsule Take 1 capsule (100 mg total) by mouth every 8 (eight) hours.   cinacalcet (SENSIPAR) 30 MG tablet Take 1 tablet (30 mg total)  by mouth 2 (two) times daily with a meal. 1 daily with breakfast and 1 daily with supper   colchicine 0.6 MG tablet Take 0.6 mg by mouth daily.   FARXIGA 10 MG TABS tablet Take 10 mg by mouth daily.   Magnesium 250 MG TABS Take 1 tablet (250 mg total) by mouth daily with breakfast.   omeprazole (PRILOSEC) 20 MG capsule Take 20 mg by mouth daily.   ursodiol (ACTIGALL) 500 MG tablet Take 500 mg by mouth in the morning and at bedtime.    [DISCONTINUED] cinacalcet (SENSIPAR) 30 MG tablet TAKE 1 TABLET (30 MG TOTAL) BY MOUTH DAILY WITH BREAKFAST.   No facility-administered encounter medications on file as of 09/13/2023.    No Known Allergies   HPI  Leslie Berry was diagnosed with hypercalcemia 2 to 3 years ago.   She was seen for significant hypercalcemia associated with high PTH consistent with  primary hyperparathyroidism.  She was started on and responding to low-dose Sensipar 30 mg p.o. daily at breakfast.   She presents with labs showing above target calcium at 11.4, improved PTH at 90. She has no side effects from Sensipar. Patient has no previously known history of pituitary, adrenal dysfunctions; no family history of such dysfunctions. -She has medical history of osteoporosis, currently on alendronate 70 mg p.o. weekly.  She continues to tolerate Fosamax. She has taken this medication for approximately 4.5 years.   Her most recent bone density is from April 2023 showing apparent improvement or stability of her bone density.  No prior history of fragility fractures or falls. No history of  kidney stones.  She reports losing at least 2 inches of height over the years.  No history of CKD.  Her labs also show hypomagnesemia.  she is not on HCTZ or other thiazide therapy.  Is currently on vitamin D supplement, presents with sufficient levels.    she is not on calcium supplements,  she eats dairy and green, leafy, vegetables on average amounts.  she does not have a family history of  hypercalcemia, pituitary tumors, thyroid cancer, or osteoporosis.  Her other medical history includes gout, hypertension, controlled type 2 diabetes.   ROS:  Constitutional: + Minimally fluctuating body weight,  no fatigue, no subjective hyperthermia, no subjective hypothermia Eyes: no blurry vision, no xerophthalmia ENT: no sore throat, no nodules palpated in throat, no dysphagia/odynophagia, no hoarseness   PE: BP 132/70   Pulse 76   Ht 5\' 5"  (1.651 m)   Wt 144 lb 6.4 oz (65.5 kg)   BMI 24.03 kg/m , Body mass index is 24.03 kg/m. Wt Readings from Last 3 Encounters:  09/13/23 144 lb 6.4 oz (65.5 kg)  03/13/23 147 lb 6.4 oz (66.9 kg)  09/08/22 152 lb 3.2 oz (69 kg)    Constitutional: + BMI of 25.4, not in acute distress, normal state of mind Eyes: PERRLA, EOMI, no exophthalmos ENT: moist mucous membranes, no gross thyromegaly, no gross cervical lymphadenopathy    CMP ( most recent) CMP     Component Value Date/Time   NA 141 09/07/2023 0927   K 4.4 09/07/2023 0927   CL 105 09/07/2023 0927   CO2 23 09/07/2023 0927   GLUCOSE 77 09/07/2023 0927   GLUCOSE 150 (H) 12/24/2011 1021   BUN 16 09/07/2023 0927   CREATININE 0.91 09/07/2023 0927   CALCIUM 11.4 (H) 09/07/2023 0927   PROT 7.3 09/07/2023 0927   ALBUMIN 3.9 09/07/2023 0927   AST 22 09/07/2023 0927   ALT 16 09/07/2023 0927   ALKPHOS 117 09/07/2023 0927   BILITOT 0.5 09/07/2023 0927   GFRNONAA 83 (L) 12/24/2011 1021   GFRAA >90 12/24/2011 1021    DualFemur Total Right 11/10/2021 83.0 Osteopenia -2.2 0.730 g/cm2 1.1% - DualFemur Total Right 02/14/2019 80.2 Osteopenia -2.3 0.722 g/cm2 -3.0% Yes DualFemur Total Right 06/02/2016 77.5 Osteopenia -2.1 0.744 g/cm2 - -   DualFemur Total Mean 11/10/2021 83.0 Osteopenia -2.0 0.761 g/cm2 0.0% - DualFemur Total Mean 02/14/2019 80.2 Osteopenia -2.0 0.761 g/cm2 1.5% - DualFemur Total Mean 06/02/2016 77.5 - - 0.750 g/cm2 - -   Left Forearm Radius 33% 11/10/2021 83.0  Osteoporosis -2.6 0.529 g/cm2 4.3% - Left Forearm Radius 33% 02/14/2019 80.2 Osteoporosis -2.9 0.508 g/cm2 1.8% - Left Forearm Radius 33% 06/02/2016 77.5 Osteoporosis -3.0 0.499 g/cm2 - -   Her labs on July 28, 2022 showed PTH 137, calcium 10.5, magnesium 1.7  Assessment: 1. Hypercalcemia /  Hyperparathyroidism  2.  Osteoporosis  3.  Hypomagnesemia  Plan: I discussed her new results with her.  Her hyperparathyroidism/hypercalcemia is complicated by osteoporosis.  - The work up so far is consistent with primary hyperparathyroidism .She is not a surgical candidate.  She presents with hypercalcemia.  I discussed and increased her Sensipar to 30 mg p.o. twice daily-with breakfast and with supper.     - I discussed with the patient about the physiology of calcium and parathyroid hormone, and possible  effects of  increased PTH/ Calcium , including kidney stones, cardiac dysrhythmias, osteoporosis, abdominal pain, etc.    -Her next bone density is not due until April 2025.  Her existing bone density was reviewed.   She is advised to continue alendronate 70 mg p.o. weekly.    She recalls taking this medication for 4.5 years at least, we will plan to proceed with alendronate long as she continues to tolerate.    Her next bone density is in May 2025 which she will complete before next visit. She is advised on fall precautions.  He is advised to maintain vitamin D and avoid calcium supplements for now. She is advised to continue with her magnesium supplement until next measurement.      She is advised to continue follow-up with primary care doctor.  I spent  25  minutes in the care of the patient today including review of labs from Thyroid Function, CMP, and other relevant labs ; imaging/biopsy records (current and previous including abstractions from other facilities); face-to-face time discussing  her lab results and symptoms, medications doses, her options of short and long term treatment  based on the latest standards of care / guidelines;   and documenting the encounter.  Dow Adolph  participated in the discussions, expressed understanding, and voiced agreement with the above plans.  All questions were answered to her satisfaction. she is encouraged to contact clinic should she have any questions or concerns prior to her return visit.    - Return in about 6 months (around 03/12/2024) for DXA Scan B4 NV, F/U with Pre-visit Labs.   Marquis Lunch, MD [facility] [address] [place], [place] [address] Phone: [phone]  Fax: [phone]    This note was partially dictated with voice recognition software. Similar sounding words can be transcribed inadequately or may not  be corrected upon review.  09/13/2023, 10:50 AM

## 2023-09-18 ENCOUNTER — Telehealth: Payer: Self-pay

## 2023-09-18 NOTE — Telephone Encounter (Signed)
 Received notification pt's insurance is requiring a prior authorization for Cinacalcet (Sensipar) 30mg  bid.

## 2023-09-19 ENCOUNTER — Other Ambulatory Visit (HOSPITAL_COMMUNITY): Payer: Self-pay

## 2023-09-19 ENCOUNTER — Telehealth: Payer: Self-pay

## 2023-09-19 NOTE — Telephone Encounter (Signed)
 Pharmacy Patient Advocate Encounter   Received notification from Pt Calls Messages that prior authorization for Cinacalcet is required/requested.   Insurance verification completed.   The patient is insured through [facility] ADVANTAGE/RX ADVANCE .   Per test claim: PA required; PA submitted to above mentioned insurance via CoverMyMeds Key/confirmation #/EOC [id number] Status is pending

## 2023-09-19 NOTE — Telephone Encounter (Signed)
 ERROR

## 2023-09-25 NOTE — Telephone Encounter (Signed)
 Pharmacy Patient Advocate Encounter  Received notification from [facility] ADVANTAGE/RX ADVANCE that Prior Authorization for Cinacalcet has been APPROVED through 07/23/24

## 2023-09-26 NOTE — Telephone Encounter (Signed)
 Pt made aware

## 2023-09-27 ENCOUNTER — Telehealth: Payer: Self-pay

## 2023-09-27 NOTE — Telephone Encounter (Signed)
 Pt called stating her Cinacalcet Rx has a $250.00 copay even with prior auth approval. Asked if there is an other option.

## 2023-09-28 NOTE — Telephone Encounter (Signed)
 Discussed with pt. Pt stated she did start her Cinacalcet.

## 2023-10-01 ENCOUNTER — Other Ambulatory Visit: Payer: Self-pay

## 2023-10-01 DIAGNOSIS — M1811 Unilateral primary osteoarthritis of first carpometacarpal joint, right hand: Secondary | ICD-10-CM | POA: Diagnosis not present

## 2023-10-01 DIAGNOSIS — M65831 Other synovitis and tenosynovitis, right forearm: Secondary | ICD-10-CM | POA: Diagnosis not present

## 2023-10-01 DIAGNOSIS — M65941 Unspecified synovitis and tenosynovitis, right hand: Secondary | ICD-10-CM | POA: Diagnosis not present

## 2023-10-01 DIAGNOSIS — M10031 Idiopathic gout, right wrist: Secondary | ICD-10-CM | POA: Diagnosis not present

## 2023-10-01 DIAGNOSIS — G8918 Other acute postprocedural pain: Secondary | ICD-10-CM | POA: Diagnosis not present

## 2023-10-01 DIAGNOSIS — M65131 Other infective (teno)synovitis, right wrist: Secondary | ICD-10-CM | POA: Diagnosis not present

## 2023-10-05 LAB — SURGICAL PATHOLOGY

## 2023-10-11 DIAGNOSIS — M109 Gout, unspecified: Secondary | ICD-10-CM | POA: Diagnosis not present

## 2023-10-16 ENCOUNTER — Other Ambulatory Visit (HOSPITAL_COMMUNITY): Payer: Self-pay | Admitting: Internal Medicine

## 2023-10-16 DIAGNOSIS — Z1231 Encounter for screening mammogram for malignant neoplasm of breast: Secondary | ICD-10-CM

## 2023-10-23 HISTORY — PX: HAND SURGERY: SHX662

## 2023-11-06 DIAGNOSIS — R809 Proteinuria, unspecified: Secondary | ICD-10-CM | POA: Diagnosis not present

## 2023-11-06 DIAGNOSIS — I10 Essential (primary) hypertension: Secondary | ICD-10-CM | POA: Diagnosis not present

## 2023-11-06 DIAGNOSIS — M109 Gout, unspecified: Secondary | ICD-10-CM | POA: Diagnosis not present

## 2023-11-06 DIAGNOSIS — M810 Age-related osteoporosis without current pathological fracture: Secondary | ICD-10-CM | POA: Diagnosis not present

## 2023-11-06 DIAGNOSIS — E1129 Type 2 diabetes mellitus with other diabetic kidney complication: Secondary | ICD-10-CM | POA: Diagnosis not present

## 2023-11-12 DIAGNOSIS — M545 Low back pain, unspecified: Secondary | ICD-10-CM | POA: Diagnosis not present

## 2023-11-12 DIAGNOSIS — R809 Proteinuria, unspecified: Secondary | ICD-10-CM | POA: Diagnosis not present

## 2023-11-12 DIAGNOSIS — I10 Essential (primary) hypertension: Secondary | ICD-10-CM | POA: Diagnosis not present

## 2023-11-12 DIAGNOSIS — M810 Age-related osteoporosis without current pathological fracture: Secondary | ICD-10-CM | POA: Diagnosis not present

## 2023-11-12 DIAGNOSIS — E1129 Type 2 diabetes mellitus with other diabetic kidney complication: Secondary | ICD-10-CM | POA: Diagnosis not present

## 2023-11-12 DIAGNOSIS — N3281 Overactive bladder: Secondary | ICD-10-CM | POA: Diagnosis not present

## 2023-11-12 DIAGNOSIS — E213 Hyperparathyroidism, unspecified: Secondary | ICD-10-CM | POA: Diagnosis not present

## 2023-11-12 DIAGNOSIS — R002 Palpitations: Secondary | ICD-10-CM | POA: Diagnosis not present

## 2023-11-12 DIAGNOSIS — M109 Gout, unspecified: Secondary | ICD-10-CM | POA: Diagnosis not present

## 2023-11-12 DIAGNOSIS — K7460 Unspecified cirrhosis of liver: Secondary | ICD-10-CM | POA: Diagnosis not present

## 2023-11-12 DIAGNOSIS — K219 Gastro-esophageal reflux disease without esophagitis: Secondary | ICD-10-CM | POA: Diagnosis not present

## 2023-11-12 DIAGNOSIS — E785 Hyperlipidemia, unspecified: Secondary | ICD-10-CM | POA: Diagnosis not present

## 2023-11-21 ENCOUNTER — Ambulatory Visit: Admitting: Obstetrics & Gynecology

## 2023-11-21 ENCOUNTER — Encounter: Payer: Self-pay | Admitting: Obstetrics & Gynecology

## 2023-11-21 VITALS — BP 164/98 | HR 86 | Ht 65.0 in | Wt 142.6 lb

## 2023-11-21 DIAGNOSIS — I10 Essential (primary) hypertension: Secondary | ICD-10-CM

## 2023-11-21 DIAGNOSIS — Z4689 Encounter for fitting and adjustment of other specified devices: Secondary | ICD-10-CM

## 2023-11-21 DIAGNOSIS — N993 Prolapse of vaginal vault after hysterectomy: Secondary | ICD-10-CM

## 2023-11-21 NOTE — Progress Notes (Signed)
     GYN VISIT Patient name: SALEM Berry MRN [id number]  Date of birth: February 20, 1941 Chief Complaint:    Chief Complaint  Patient presents with   Pessary Check   History of Present Illness:   Leslie Berry is a 83 y.o. G4P0 PM female being seen today for pessary maintenance.     She uses a Gelhorn pessary 2 She reports no vaginal discharge and a small amount of vaginal bleeding on occasion if she has to strain to have a BM.  The bleeding is bright red, very minimal and typically noted on her pad and may not last even last the full day.  She states the bleeding is clearly vaginal, not from her bottom.  She otherwise reports no acute GYN concerns and so far the pessary is still been working well for her  Likert scale(1 not bothersome -5 very bothersome)  :  2  Review of Systems:   Pertinent items are noted in HPI Denies fever/chills, dizziness, headaches, visual disturbances, fatigue, shortness of breath, chest pain, abdominal pain, vomiting Pertinent History Reviewed:  Reviewed past medical,surgical, social, obstetrical and family history.  Reviewed problem list, medications and allergies. Physical Assessment:   Vitals:   11/21/23 1134 11/21/23 1202  BP: (!) 149/94 (!) 164/98  Pulse: 86   Weight: 142 lb 9.6 oz (64.7 kg)   Height: 5\' 5"  (1.651 m)   Body mass index is 23.73 kg/m.       Physical Examination:   General appearance: alert, well appearing, and in no distress  Psych: mood appropriate, normal affect  Skin: warm & dry   Cardiovascular: normal heart rate noted  Respiratory: normal respiratory effort, no distress  Abdomen: soft, non-tender   GU: normal external genitalia.   The pessary is removed. Vagina: Exam reveals no undue vaginal mucosal pressure of breakdown, no discharge and no vaginal bleeding.  Vaginal Epithelial Abnormality Classification System:   0 0    No abnormalities 1    Epithelial erythema 2    Granulation tissue 3    Epithelial break or  erosion, 1 cm or less 4    Epithelial break or erosion, 1 cm or greater   Irrigation completed- no vaginal bleeding or abnormalities noted.  Pessary replaced without difficulty  Extremities: no edema   Chaperone: Lorean Rodes    Assessment & Plan:     ICD-10-CM   1. Pessary maintenance  Z46.89     2. Vaginal vault prolapse after hysterectomy: Gelhorn 2.25 inch  N99.3       - For now plan is to continue with pessary maintenance and she replaces on her own about every month - Discussed alternative option including referral to urogynecology to discuss surgical intervention - Briefly discussed surgical options and stated that this would be a conversation that she had with urogynecology.  At this time she wishes to hold off on surgical referral and will continue with pessary use  3)elevated BP with HTN - Plan to follow-up with PCP  No orders of the defined types were placed in this encounter.   Return in about 1 year (around 11/20/2024) for pessary check.   Dominic Rhome, DO Attending Obstetrician & Gynecologist, [facility] for [employer], [facility]

## 2023-12-03 ENCOUNTER — Ambulatory Visit (HOSPITAL_COMMUNITY)
Admission: RE | Admit: 2023-12-03 | Discharge: 2023-12-03 | Disposition: A | Discharge status: Home / Self Care | Source: Ambulatory Visit | Attending: "Endocrinology | Admitting: "Endocrinology

## 2023-12-03 ENCOUNTER — Ambulatory Visit (HOSPITAL_COMMUNITY)
Admission: RE | Admit: 2023-12-03 | Discharge: 2023-12-03 | Disposition: A | Discharge status: Home / Self Care | Source: Ambulatory Visit | Attending: Internal Medicine | Admitting: Internal Medicine

## 2023-12-03 DIAGNOSIS — M810 Age-related osteoporosis without current pathological fracture: Secondary | ICD-10-CM | POA: Diagnosis not present

## 2023-12-03 DIAGNOSIS — Z780 Asymptomatic menopausal state: Secondary | ICD-10-CM | POA: Diagnosis not present

## 2023-12-03 DIAGNOSIS — Z1231 Encounter for screening mammogram for malignant neoplasm of breast: Secondary | ICD-10-CM | POA: Diagnosis not present

## 2024-01-03 DIAGNOSIS — I10 Essential (primary) hypertension: Secondary | ICD-10-CM | POA: Diagnosis not present

## 2024-01-03 DIAGNOSIS — R252 Cramp and spasm: Secondary | ICD-10-CM | POA: Diagnosis not present

## 2024-01-03 DIAGNOSIS — Z79899 Other long term (current) drug therapy: Secondary | ICD-10-CM | POA: Diagnosis not present

## 2024-01-03 DIAGNOSIS — Z87891 Personal history of nicotine dependence: Secondary | ICD-10-CM | POA: Diagnosis not present

## 2024-01-10 DIAGNOSIS — R252 Cramp and spasm: Secondary | ICD-10-CM | POA: Diagnosis not present

## 2024-01-10 DIAGNOSIS — Z87891 Personal history of nicotine dependence: Secondary | ICD-10-CM | POA: Diagnosis not present

## 2024-01-10 DIAGNOSIS — Z79899 Other long term (current) drug therapy: Secondary | ICD-10-CM | POA: Diagnosis not present

## 2024-01-10 DIAGNOSIS — I10 Essential (primary) hypertension: Secondary | ICD-10-CM | POA: Diagnosis not present

## 2024-01-24 DIAGNOSIS — H903 Sensorineural hearing loss, bilateral: Secondary | ICD-10-CM | POA: Diagnosis not present

## 2024-02-13 ENCOUNTER — Other Ambulatory Visit: Payer: Self-pay | Admitting: "Endocrinology

## 2024-02-21 DIAGNOSIS — H903 Sensorineural hearing loss, bilateral: Secondary | ICD-10-CM | POA: Diagnosis not present

## 2024-02-25 ENCOUNTER — Telehealth: Payer: Self-pay

## 2024-02-25 NOTE — Telephone Encounter (Signed)
 Up to date on meds, next review in September

## 2024-03-12 ENCOUNTER — Ambulatory Visit: Payer: PPO | Admitting: "Endocrinology

## 2024-03-12 DIAGNOSIS — E210 Primary hyperparathyroidism: Secondary | ICD-10-CM | POA: Diagnosis not present

## 2024-03-14 LAB — PTH, INTACT AND CALCIUM
Calcium: 10.4 mg/dL — ABNORMAL HIGH (ref 8.7–10.3)
PTH: 136 pg/mL — AB (ref 15–65)

## 2024-03-14 LAB — MAGNESIUM: Magnesium: 1.8 mg/dL (ref 1.6–2.3)

## 2024-03-19 ENCOUNTER — Encounter: Payer: Self-pay | Admitting: "Endocrinology

## 2024-03-19 ENCOUNTER — Ambulatory Visit: Admitting: "Endocrinology

## 2024-03-19 VITALS — BP 138/88 | HR 68 | Ht 65.0 in | Wt 143.4 lb

## 2024-03-19 DIAGNOSIS — M810 Age-related osteoporosis without current pathological fracture: Secondary | ICD-10-CM | POA: Diagnosis not present

## 2024-03-19 DIAGNOSIS — E210 Primary hyperparathyroidism: Secondary | ICD-10-CM | POA: Diagnosis not present

## 2024-03-19 NOTE — Progress Notes (Signed)
 03/19/2024, 3:25 PM  Endocrinology follow-up note  Leslie Berry is a 83 y.o.-year-old female, referred by her  Shona Norleen PEDLAR, MD  , for evaluation for hypercalcemia/hyperparathyroidism.   Past Medical History:  Diagnosis Date   Arthritis    Diabetes mellitus    Elevated LFTs    GERD (gastroesophageal reflux disease)    Gout    Hypertension    dr bland  pcp   Osteoporosis     Past Surgical History:  Procedure Laterality Date   CARPAL TUNNEL RELEASE     CARPAL TUNNEL RELEASE  12/22/2011   Procedure: CARPAL TUNNEL RELEASE;  Surgeon: Rome JULIANNA Pepper, MD;  Location: [facility] OR;  Service: Orthopedics;  Laterality: Right;   CESAREAN SECTION     HAND SURGERY Right 10/2023   ROTATOR CUFF REPAIR      Social History   Tobacco Use   Smoking status: Former    Current packs/day: 0.00    Types: Cigarettes    Quit date: 05/15/1983    Years since quitting: 40.8   Smokeless tobacco: Never  Vaping Use   Vaping status: Never Used  Substance Use Topics   Alcohol use: No   Drug use: No    Family History  Problem Relation Age of Onset   Thyroid  disease Mother    Hypertension Mother    Hyperlipidemia Mother    Stroke Father    Cancer Sister    Hypertension Maternal Grandmother    Hypertension Maternal Grandfather    Anesthesia problems Neg Hx     Outpatient Encounter Medications as of 03/19/2024  Medication Sig   acetaminophen  (TYLENOL ) 500 MG tablet Take 1,000 mg by mouth every 12 (twelve) hours as needed. Arthritis   alendronate (FOSAMAX) 70 MG tablet Take 70 mg by mouth once a week.   allopurinol (ZYLOPRIM) 100 MG tablet Take 100 mg by mouth daily.   amLODipine  (NORVASC ) 10 MG tablet Take 10 mg by mouth daily.   aspirin EC 81 MG tablet Take 81 mg by mouth daily. Swallow whole.   benzonatate  (TESSALON ) 100 MG capsule Take 1 capsule (100 mg total) by mouth every 8 (eight) hours. (Patient not taking: Reported on  11/21/2023)   cinacalcet  (SENSIPAR ) 30 MG tablet TAKE 1 TABS BY MOUTH 2 (TWO) TIMES DAILY WITH A MEAL. 1 DAILY WITH BREAKFAST AND 1 DAILY WITH SUPPER   colchicine  0.6 MG tablet Take 0.6 mg by mouth daily.   FARXIGA 10 MG TABS tablet Take 10 mg by mouth daily.   Magnesium  250 MG TABS Take 1 tablet (250 mg total) by mouth daily with breakfast.   omeprazole (PRILOSEC) 20 MG capsule Take 20 mg by mouth daily.   ursodiol  (ACTIGALL ) 500 MG tablet Take 500 mg by mouth in the morning and at bedtime.    No facility-administered encounter medications on file as of 03/19/2024.    No Known Allergies   HPI  Leslie Berry was diagnosed with hypercalcemia 2 to 3 years ago.   She was seen for significant hypercalcemia associated with high PTH consistent with primary hyperparathyroidism.  She was started on and responding to  lSensipar , currently 30 mg p.o. twice daily.     She presents with labs showing improved calcium to 10.4 dropping from 11.4, and PTH staying high at 136.   She is tolerating her Sensipar  so far.  Patient has no previously known history of pituitary, adrenal dysfunctions; no family history of such dysfunctions. -She has medical history of osteoporosis, currently on alendronate 70 mg p.o. weekly.  She continues to tolerate Fosamax. She has taken this medication for approximately 5 years.    Her most recent bone density was consistent with further more losses mainly on the left forearm with T-score of -3.0.   No interval fragility fractures nor any prior reports of fragility fractures.  No history of  kidney stones.  She reports losing at least 2 inches of height over the years.  No history of CKD.  Her labs also show hypomagnesemia.  she is not on HCTZ or other thiazide therapy.  She is currently on vitamin D  supplement.  she is not on calcium supplements,  she eats dairy and green, leafy, vegetables on average amounts.  she does not have a family history of hypercalcemia,  pituitary tumors, thyroid  cancer, or osteoporosis.  Her other medical history includes gout, hypertension, controlled type 2 diabetes.   ROS:  Constitutional: + Minimally fluctuating body weight,  no fatigue, no subjective hyperthermia, no subjective hypothermia Eyes: no blurry vision, no xerophthalmia ENT: no sore throat, no nodules palpated in throat, no dysphagia/odynophagia, no hoarseness   PE: BP 138/88   Pulse 68   Ht 5' 5 (1.651 m)   Wt 143 lb 6.4 oz (65 kg)   BMI 23.86 kg/m , Body mass index is 23.86 kg/m. Wt Readings from Last 3 Encounters:  03/19/24 143 lb 6.4 oz (65 kg)  11/21/23 142 lb 9.6 oz (64.7 kg)  09/13/23 144 lb 6.4 oz (65.5 kg)    Constitutional: + BMI of 25.4, not in acute distress, normal state of mind Eyes: PERRLA, EOMI, no exophthalmos ENT: moist mucous membranes, no gross thyromegaly, no gross cervical lymphadenopathy    CMP ( most recent) CMP     Component Value Date/Time   NA 141 09/07/2023 0927   K 4.4 09/07/2023 0927   CL 105 09/07/2023 0927   CO2 23 09/07/2023 0927   GLUCOSE 77 09/07/2023 0927   GLUCOSE 150 (H) 12/24/2011 1021   BUN 16 09/07/2023 0927   CREATININE 0.91 09/07/2023 0927   CALCIUM 10.4 (H) 03/12/2024 0833   PROT 7.3 09/07/2023 0927   ALBUMIN 3.9 09/07/2023 0927   AST 22 09/07/2023 0927   ALT 16 09/07/2023 0927   ALKPHOS 117 09/07/2023 0927   BILITOT 0.5 09/07/2023 0927   GFRNONAA 83 (L) 12/24/2011 1021   GFRAA >90 12/24/2011 1021    Previsit bone density on Dec 03, 2023 Exclusions: Lumbar spine due to degenerative changes.   COMPARISON:  11/10/2021.   FINDINGS: Scan quality: Good.   LEFT FEMORAL NECK:   BMD (in g/cm2): 0.705   T-score: -2.4   Z-score: -0.9   Rate of change from previous exam: -13.4 %   LEFT TOTAL HIP:   BMD (in g/cm2): 0.751   T-score: -2.0   Z-score: -0.7   RIGHT FEMORAL NECK:   BMD (in g/cm2): 0.704   T-score: -2.4   Z-score: -0.9   Rate of change from previous exam:  -5.1 %   RIGHT TOTAL HIP:   BMD (in g/cm2): 0.707   T-score: -2.4   Z-score: -1.1  DUAL-FEMUR TOTAL MEAN:   Rate of change from previous exam: -4.2 %   LEFT FOREARM (RADIUS 33%):   BMD (in g/cm2): 0.496   T-score: -3.0   Z-score: -0.5   Rate of change from previous exam: No significant rate of change from previous exam.   FRAX 10-YEAR PROBABILITY OF FRACTURE:   FRAX not reported as the patient is receiving bone building therapy.   IMPRESSION: Osteoporosis based on BMD.   Fracture risk is unknown due to history of bone building therapy.  Assessment: 1. Hypercalcemia / Hyperparathyroidism  2.  Osteoporosis  3.  Hypomagnesemia  Plan: I discussed her new lab results as well as bone density.  Leslie Berry  Her hyperparathyroidism/hypercalcemia is complicated by osteoporosis.  - The work up so far is consistent with primary hyperparathyroidism .She is not a surgical candidate.  She has responded to Sensipar  from the point of view of hypercalcemia.  She is advised to continue Sensipar  30 mg p.o. 2 times daily -with breakfast and supper.   - I discussed with the patient about the physiology of calcium and parathyroid hormone, and possible  effects of  increased PTH/ Calcium , including kidney stones, cardiac dysrhythmias, osteoporosis, abdominal pain, etc.    Her bone density was discussed with her.  She did not develop any fragility fractures while on treatment and that with the exception of forearm most of her T-scores are higher than -2.4.     She will continue to benefit from intervention, is advised to continue alendronate 70 mg p.o. weekly.  She has taken this medication roughly for 5 years.  After her next visit/1 density, she will be considered for either switching to a different treatment or drug holiday.  She is advised to maintain  vitamin D  supplements for now.  Her magnesium  was corrected at 1.8.  She is advised to continue follow-up with primary care doctor.   I spent  26   minutes in the care of the patient today including review of labs from Thyroid  Function, CMP, and other relevant labs ; imaging/biopsy records (current and previous including abstractions from other facilities); face-to-face time discussing  her lab results and symptoms, medications doses, her options of short and long term treatment based on the latest standards of care / guidelines;   and documenting the encounter.  Leslie Berry  participated in the discussions, expressed understanding, and voiced agreement with the above plans.  All questions were answered to her satisfaction. she is encouraged to contact clinic should she have any questions or concerns prior to her return visit.     - Return in about 6 months (around 09/19/2024) for F/U with Pre-visit Labs.   Leslie Earl, MD [facility] [address] [place], [place] [address] Phone: [phone]  Fax: [phone]    This note was partially dictated with voice recognition software. Similar sounding words can be transcribed inadequately or may not  be corrected upon review.  03/19/2024, 3:25 PM

## 2024-04-24 DIAGNOSIS — K219 Gastro-esophageal reflux disease without esophagitis: Secondary | ICD-10-CM | POA: Diagnosis not present

## 2024-04-24 DIAGNOSIS — K743 Primary biliary cirrhosis: Secondary | ICD-10-CM | POA: Diagnosis not present

## 2024-04-24 DIAGNOSIS — K5730 Diverticulosis of large intestine without perforation or abscess without bleeding: Secondary | ICD-10-CM | POA: Diagnosis not present

## 2024-05-04 ENCOUNTER — Telehealth: Payer: Self-pay | Admitting: Internal Medicine

## 2024-05-04 ENCOUNTER — Emergency Department (HOSPITAL_COMMUNITY)
Admission: EM | Admit: 2024-05-04 | Discharge: 2024-05-04 | Disposition: A | Discharge status: Home / Self Care | Attending: Emergency Medicine | Admitting: Emergency Medicine

## 2024-05-04 ENCOUNTER — Encounter (HOSPITAL_COMMUNITY): Payer: Self-pay

## 2024-05-04 ENCOUNTER — Other Ambulatory Visit: Payer: Self-pay

## 2024-05-04 DIAGNOSIS — R7989 Other specified abnormal findings of blood chemistry: Secondary | ICD-10-CM | POA: Insufficient documentation

## 2024-05-04 DIAGNOSIS — Z7982 Long term (current) use of aspirin: Secondary | ICD-10-CM | POA: Diagnosis not present

## 2024-05-04 DIAGNOSIS — K625 Hemorrhage of anus and rectum: Secondary | ICD-10-CM | POA: Insufficient documentation

## 2024-05-04 LAB — PROTIME-INR
INR: 1 (ref 0.8–1.2)
Prothrombin Time: 13.3 s (ref 11.4–15.2)

## 2024-05-04 LAB — CBC WITH DIFFERENTIAL/PLATELET
Abs Immature Granulocytes: 0.01 K/uL (ref 0.00–0.07)
Basophils Absolute: 0.1 K/uL (ref 0.0–0.1)
Basophils Relative: 1 %
Eosinophils Absolute: 0.2 K/uL (ref 0.0–0.5)
Eosinophils Relative: 3 %
HCT: 42.5 % (ref 36.0–46.0)
Hemoglobin: 13.7 g/dL (ref 12.0–15.0)
Immature Granulocytes: 0 %
Lymphocytes Relative: 59 %
Lymphs Abs: 3.5 K/uL (ref 0.7–4.0)
MCH: 32.1 pg (ref 26.0–34.0)
MCHC: 32.2 g/dL (ref 30.0–36.0)
MCV: 99.5 fL (ref 80.0–100.0)
Monocytes Absolute: 0.4 K/uL (ref 0.1–1.0)
Monocytes Relative: 7 %
Neutro Abs: 1.8 K/uL (ref 1.7–7.7)
Neutrophils Relative %: 30 %
Platelets: 243 K/uL (ref 150–400)
RBC: 4.27 MIL/uL (ref 3.87–5.11)
RDW: 13 % (ref 11.5–15.5)
WBC: 5.9 K/uL (ref 4.0–10.5)
nRBC: 0 % (ref 0.0–0.2)

## 2024-05-04 LAB — COMPREHENSIVE METABOLIC PANEL WITH GFR
ALT: 19 U/L (ref 0–44)
AST: 26 U/L (ref 15–41)
Albumin: 4 g/dL (ref 3.5–5.0)
Alkaline Phosphatase: 103 U/L (ref 38–126)
Anion gap: 10 (ref 5–15)
BUN: 24 mg/dL — ABNORMAL HIGH (ref 8–23)
CO2: 24 mmol/L (ref 22–32)
Calcium: 9.8 mg/dL (ref 8.9–10.3)
Chloride: 107 mmol/L (ref 98–111)
Creatinine, Ser: 1.07 mg/dL — ABNORMAL HIGH (ref 0.44–1.00)
GFR, Estimated: 51 mL/min — ABNORMAL LOW (ref >60)
Glucose, Bld: 109 mg/dL — ABNORMAL HIGH (ref 70–99)
Potassium: 4.2 mmol/L (ref 3.5–5.1)
Sodium: 141 mmol/L (ref 135–145)
Total Bilirubin: 0.4 mg/dL (ref 0.0–1.2)
Total Protein: 7 g/dL (ref 6.5–8.1)

## 2024-05-04 LAB — TYPE AND SCREEN
ABO/RH(D): A POS
Antibody Screen: NEGATIVE

## 2024-05-04 MED ORDER — PANTOPRAZOLE SODIUM 40 MG IV SOLR
40.0000 mg | Freq: Once | INTRAVENOUS | Status: AC
  Administered 2024-05-04: 40 mg via INTRAVENOUS
  Filled 2024-05-04: qty 10

## 2024-05-04 MED ORDER — SODIUM CHLORIDE 0.9 % IV BOLUS
1000.0000 mL | Freq: Once | INTRAVENOUS | Status: AC
  Administered 2024-05-04: 1000 mL via INTRAVENOUS

## 2024-05-04 NOTE — ED Notes (Addendum)
 Pt ambulated around nurses station with steady gait and minimal assistance; pt stated that she did not feel dizzy while amb

## 2024-05-04 NOTE — ED Triage Notes (Signed)
 Patient stated woke up this morning thinking I had diarrhea but noted bowel was bloody. Denise pain.

## 2024-05-04 NOTE — ED Provider Notes (Signed)
 [place] EMERGENCY DEPARTMENT AT [facility] Provider Note   CSN: [id number] Arrival date & time: 05/04/24  9162     Patient presents with: Rectal Bleeding   Leslie Berry is a 83 y.o. female.  She is brought in by ambulance after experiencing some rectal bleeding this morning.  She said she felt like she needed to move her bowels and then had some stool passed but also a lot of dark red blood.  Never had before.  Not associated with abdominal pain.  No chest pain dizziness lightheadedness syncope.  Not on a blood thinner.  She said she has had hemorrhoid bleeding in the past but this was much more significant.   The history is provided by the patient.  Rectal Bleeding Quality:  Maroon Amount:  Moderate Chronicity:  New Context: defecation   Similar prior episodes: no   Relieved by:  None tried Associated symptoms: no abdominal pain, no dizziness, no fever, no hematemesis, no light-headedness, no loss of consciousness and no vomiting   Risk factors: no anticoagulant use        Prior to Admission medications   Medication Sig Start Date End Date Taking? Authorizing Provider  acetaminophen  (TYLENOL ) 500 MG tablet Take 1,000 mg by mouth every 12 (twelve) hours as needed. Arthritis    [provider]  alendronate (FOSAMAX) 70 MG tablet Take 70 mg by mouth once a week. 11/05/21   [provider]  allopurinol (ZYLOPRIM) 100 MG tablet Take 100 mg by mouth daily. 09/08/21   [provider]  amLODipine  (NORVASC ) 10 MG tablet Take 10 mg by mouth daily.    [provider]  aspirin EC 81 MG tablet Take 81 mg by mouth daily. Swallow whole.    [provider]  benzonatate  (TESSALON ) 100 MG capsule Take 1 capsule (100 mg total) by mouth every 8 (eight) hours. Patient not taking: Reported on 11/21/2023 04/15/23   Stuart Vernell Norris, PA-C  cinacalcet  (SENSIPAR ) 30 MG tablet TAKE 1 TABS BY MOUTH 2 (TWO) TIMES DAILY WITH A MEAL. 1 DAILY WITH  BREAKFAST AND 1 DAILY WITH SUPPER 02/13/24   Nida, Gebreselassie W, MD  colchicine  0.6 MG tablet Take 0.6 mg by mouth daily. 10/19/21   [provider]  FARXIGA 10 MG TABS tablet Take 10 mg by mouth daily. 03/02/23   [provider]  Magnesium  250 MG TABS Take 1 tablet (250 mg total) by mouth daily with breakfast. 03/09/22   Nida, Gebreselassie W, MD  omeprazole (PRILOSEC) 20 MG capsule Take 20 mg by mouth daily.    [provider]  ursodiol  (ACTIGALL ) 500 MG tablet Take 500 mg by mouth in the morning and at bedtime.  08/04/16   [provider]    Allergies: Patient has no known allergies.    Review of Systems  Constitutional:  Negative for fever.  Gastrointestinal:  Positive for hematochezia. Negative for abdominal pain, hematemesis and vomiting.  Neurological:  Negative for dizziness, loss of consciousness and light-headedness.    Updated Vital Signs BP (!) 174/111   Pulse (!) 121   Resp 14   Ht 5' 5 (1.651 m)   Wt 64.9 kg   SpO2 93%   BMI 23.80 kg/m   Physical Exam Vitals and nursing note reviewed.  Constitutional:      General: She is not in acute distress.    Appearance: Normal appearance. She is well-developed.  HENT:     Head: Normocephalic and atraumatic.  Eyes:  Conjunctiva/sclera: Conjunctivae normal.  Cardiovascular:     Rate and Rhythm: Regular rhythm. Tachycardia present.     Heart sounds: No murmur heard. Pulmonary:     Effort: Pulmonary effort is normal. No respiratory distress.     Breath sounds: Normal breath sounds. No stridor. No wheezing.  Abdominal:     Palpations: Abdomen is soft.     Tenderness: There is no abdominal tenderness. There is no guarding or rebound.  Musculoskeletal:        General: No deformity.     Cervical back: Neck supple.  Skin:    General: Skin is warm and dry.  Neurological:     General: No focal deficit present.     Mental Status: She is alert.     GCS: GCS eye subscore is 4. GCS verbal  subscore is 5. GCS motor subscore is 6.     Motor: No weakness.     (all labs ordered are listed, but only abnormal results are displayed) Labs Reviewed  COMPREHENSIVE METABOLIC PANEL WITH GFR - Abnormal; Notable for the following components:      Result Value   Glucose, Bld 109 (*)    BUN 24 (*)    Creatinine, Ser 1.07 (*)    GFR, Estimated 51 (*)    All other components within normal limits  CBC WITH DIFFERENTIAL/PLATELET  PROTIME-INR  POC OCCULT BLOOD, ED  TYPE AND SCREEN    EKG: None  Radiology: No results found.   Procedures   Medications Ordered in the ED  pantoprazole  (PROTONIX ) injection 40 mg (40 mg Intravenous Given 05/04/24 0916)  sodium chloride  0.9 % bolus 1,000 mL (0 mLs Intravenous Stopped 05/04/24 1147)    Clinical Course as of 05/04/24 1644  Sun May 04, 2024  0921 Rectal exam done with [facility] as chaperone.  She did have a prolapsed hemorrhoid.  No active bleeding.  Sample sent for guaiac. [MB]  [id number] Colonoscopy 2012 -  IMPRESSION:  1.  Small internal hemorrhoids.  2.  Sigmoid diverticulosis.  3.  No masses or polyps seen.  4.  Normal terminal ileum.  5.  Some residual stool in the colon, multiple washes done.  [MB]  [id number] Rectal exam was guaiac positive.  Open stable.  Not orthostatic.  No abdominal pain. [MB]  1008 Discussed with Dr. Cindie GI.  He felt that with 1 episode not on blood thinners and hemodynamically stable she would be appropriate for outpatient follow-up in the office this week. [MB]  1231 she is tolerated p.o. and has had no further episodes here she is comfortable plan for discharge.  Return instructions discussed [MB]    Clinical Course User Index [MB] Towana Ozell BROCKS, MD                                 Medical Decision Making Amount and/or Complexity of Data Reviewed Labs: ordered.  Risk Prescription drug management.   This patient complains of rectal bleeding; this involves an extensive number of treatment Options and  is a complaint that carries with it a high risk of complications and morbidity. The differential includes diverticulosis, diverticulitis, AVM, hemorrhoids  I ordered, reviewed and interpreted labs, which included CBC normal, chemistries with mildly elevated BUN and creatinine, fecal occult positive, INR normal I ordered medication IV fluids and PPI and reviewed PMP when indicated. Additional history obtained from patient's family members Previous records obtained and reviewed in epic, no  recent admissions for similar I consulted GI Dr. Cindie and discussed lab and imaging findings and discussed disposition.  Cardiac monitoring reviewed, sinus rhythm Social determinants considered, no significant barriers Critical Interventions: None  After the interventions stated above, I reevaluated the patient and found patient to be hemodynamically stable with no further bleeding episodes Admission and further testing considered, GI feel she is appropriate for outpatient follow-up and she is comfortable with that.  She understands to monitor her bowel movements and return to the emergency department if any worsening or concerning symptoms.      Final diagnoses:  Rectal bleeding    ED Discharge Orders     None          Towana Ozell BROCKS, MD 05/04/24 1645

## 2024-05-04 NOTE — Telephone Encounter (Signed)
 Patient presented to ER today for 1 episode of rectal bleeding.  Clinically stable, being discharged home.  Can we get her in for urgent OV this week?  Thank you

## 2024-05-04 NOTE — Discharge Instructions (Signed)
 Please rest and keep well-hydrated.  Dr. Cindie gastroenterology should be calling you Monday or Tuesday to arrange follow-up.  Return to the emergency department if worsening bleeding dizziness faintness or other concerns.

## 2024-05-04 NOTE — ED Notes (Signed)
 ED Provider at bedside.

## 2024-05-05 LAB — POC OCCULT BLOOD, ED: Fecal Occult Bld: POSITIVE — AB

## 2024-05-06 DIAGNOSIS — R809 Proteinuria, unspecified: Secondary | ICD-10-CM | POA: Diagnosis not present

## 2024-05-06 DIAGNOSIS — M810 Age-related osteoporosis without current pathological fracture: Secondary | ICD-10-CM | POA: Diagnosis not present

## 2024-05-06 DIAGNOSIS — I10 Essential (primary) hypertension: Secondary | ICD-10-CM | POA: Diagnosis not present

## 2024-05-06 DIAGNOSIS — E1129 Type 2 diabetes mellitus with other diabetic kidney complication: Secondary | ICD-10-CM | POA: Diagnosis not present

## 2024-05-06 DIAGNOSIS — M109 Gout, unspecified: Secondary | ICD-10-CM | POA: Diagnosis not present

## 2024-05-07 ENCOUNTER — Telehealth (INDEPENDENT_AMBULATORY_CARE_PROVIDER_SITE_OTHER): Payer: Self-pay

## 2024-05-07 ENCOUNTER — Ambulatory Visit: Admitting: Gastroenterology

## 2024-05-07 ENCOUNTER — Encounter: Payer: Self-pay | Admitting: Gastroenterology

## 2024-05-07 VITALS — BP 133/80 | HR 79 | Temp 98.0°F | Ht 65.0 in | Wt 143.4 lb

## 2024-05-07 DIAGNOSIS — K625 Hemorrhage of anus and rectum: Secondary | ICD-10-CM | POA: Diagnosis not present

## 2024-05-07 DIAGNOSIS — K743 Primary biliary cirrhosis: Secondary | ICD-10-CM | POA: Diagnosis not present

## 2024-05-07 MED ORDER — PEG 3350-KCL-NA BICARB-NACL 420 G PO SOLR
4000.0000 mL | Freq: Once | ORAL | 0 refills | Status: AC
Start: 2024-05-07 — End: 2024-05-07

## 2024-05-07 NOTE — Telephone Encounter (Signed)
 Spoke with patients daughter Gaylene on [place]) and scheduled pt's colonoscopy on 05/30/2024 at 8:30am. Rx sent to [facility]aramcy. Instructions sent through mail. Aslo informed daughter of ultrasound appointment on 05/13/2024 at 11:30am.

## 2024-05-07 NOTE — Progress Notes (Signed)
 Gastroenterology Office Note    Referring Provider: Shona Norleen PEDLAR, MD Primary Care Physician:  Shona Norleen PEDLAR, MD  Primary GI: Dr Kristie [facility]   Chief Complaint   Chief Complaint  Patient presents with   Rectal Bleeding    Pt arrives for hospital follow up. Pt was at [facility] on 05/04/24 due to rectal bleeding. Pt states no bleeding at this time.      History of Present Illness   Leslie Berry is an 83 y.o. female presenting today at the request of [facility] due to acute onset rectal bleeding 10/12. Hgb 13.7 in ED. She also has a hx of PBC, previously followed by Dr. Kristie in [place] at [facility]. She also has a hx of PBC and on Urso  BID. ED consulted with on call GI (Dr Cindie), who recommended urgent office visit.   Dark burgundy blood on Sunday, large amount, went to ED, happened again at home. Last episode of bleeding Monday. Will see a little bit of blood on pad. Back was hurting last week and took tylenol , no NSAIDs. No rectal pain.   Hgb 13.7 in ED.   Bowels are hard. Usually BID BM. Mild straining. Doesn't take anything OTC. If eats fruits, will have softer stool. No weight loss or lack of appetite.   Omeprazole daily. No dysphagia. Urso  BID  Last colonoscopy at Dr. Nola office in [place], about 13 years ago. Possible polyps.  Last EGD at same time.    Unsure if daughter had colon cancer or not but had some type of cancer  Patient does not believe she has been seen by primary GI in awhile and agreeable to have colonoscopy here and continue care here.    Past Medical History:  Diagnosis Date   Arthritis    Diabetes mellitus    Elevated LFTs    GERD (gastroesophageal reflux disease)    Gout    Hypertension    dr bland  pcp   Osteoporosis     Past Surgical History:  Procedure Laterality Date   CARPAL TUNNEL RELEASE     CARPAL TUNNEL RELEASE  12/22/2011   Procedure: CARPAL TUNNEL RELEASE;  Surgeon: Rome JULIANNA Pepper, MD;   Location: [facility] OR;  Service: Orthopedics;  Laterality: Right;   CESAREAN SECTION     HAND SURGERY Right 10/2023   ROTATOR CUFF REPAIR      Current Outpatient Medications  Medication Sig Dispense Refill   acetaminophen  (TYLENOL ) 500 MG tablet Take 1,000 mg by mouth every 12 (twelve) hours as needed. Arthritis     alendronate (FOSAMAX) 70 MG tablet Take 70 mg by mouth once a week.     allopurinol (ZYLOPRIM) 100 MG tablet Take 100 mg by mouth daily.     amLODipine  (NORVASC ) 10 MG tablet Take 10 mg by mouth daily.     cinacalcet  (SENSIPAR ) 30 MG tablet TAKE 1 TABS BY MOUTH 2 (TWO) TIMES DAILY WITH A MEAL. 1 DAILY WITH BREAKFAST AND 1 DAILY WITH SUPPER 180 tablet 1   colchicine  0.6 MG tablet Take 0.6 mg by mouth daily.     FARXIGA 10 MG TABS tablet Take 10 mg by mouth daily.     Magnesium  250 MG TABS Take 1 tablet (250 mg total) by mouth daily with breakfast. 90 tablet 1   omeprazole (PRILOSEC) 20 MG capsule Take 20 mg by mouth daily.     ursodiol  (ACTIGALL ) 500 MG tablet Take 500 mg  by mouth in the morning and at bedtime.      aspirin EC 81 MG tablet Take 81 mg by mouth daily. Swallow whole. (Patient not taking: Reported on 05/07/2024)     No current facility-administered medications for this visit.    Allergies as of 05/07/2024   (No Known Allergies)    Family History  Problem Relation Age of Onset   Thyroid  disease Mother    Hypertension Mother    Hyperlipidemia Mother    Stroke Father    Cancer Sister    Hypertension Maternal Grandmother    Hypertension Maternal Grandfather    Anesthesia problems Neg Hx     Social History   Socioeconomic History   Marital status: Widowed    Spouse name: Not on file   Number of children: Not on file   Years of education: Not on file   Highest education level: Not on file  Occupational History   Not on file  Tobacco Use   Smoking status: Former    Current packs/day: 0.00    Types: Cigarettes    Quit date: 05/15/1983    Years since  quitting: 41.0   Smokeless tobacco: Never  Vaping Use   Vaping status: Never Used  Substance and Sexual Activity   Alcohol use: No   Drug use: No   Sexual activity: Not Currently    Birth control/protection: Post-menopausal  Other Topics Concern   Not on file  Social History Narrative   Not on file   Social Drivers of Health   Financial Resource Strain: Not on file  Food Insecurity: Not on file  Transportation Needs: Not on file  Physical Activity: Not on file  Stress: Not on file  Social Connections: Not on file  Intimate Partner Violence: Not on file     Review of Systems   Gen: Denies any fever, chills, fatigue, weight loss, lack of appetite.  CV: Denies chest pain, heart palpitations, peripheral edema, syncope.  Resp: Denies shortness of breath at rest or with exertion. Denies wheezing or cough.  GI: Denies dysphagia or odynophagia. Denies jaundice, hematemesis, fecal incontinence. GU : Denies urinary burning, urinary frequency, urinary hesitancy MS: Denies joint pain, muscle weakness, cramps, or limitation of movement.  Derm: Denies rash, itching, dry skin Psych: Denies depression, anxiety, memory loss, and confusion Heme: Denies bruising, bleeding, and enlarged lymph nodes.   Physical Exam   BP 133/80   Pulse 79   Temp 98 F (36.7 C)   Ht 5' 5 (1.651 m)   Wt 143 lb 6.4 oz (65 kg)   BMI 23.86 kg/m  General:   Alert and oriented. Pleasant and cooperative. Well-nourished and well-developed.  Head:  Normocephalic and atraumatic. Eyes:  Without icterus Ears:  hard of hearing: wears hearing aids Lungs:  Clear to auscultation bilaterally.  Heart:  S1, S2 present without murmurs appreciated.  Abdomen:  +BS, soft, non-tender and non-distended. No HSM noted. No guarding or rebound. No masses appreciated.  Rectal:  Deferred as recently done in the ED Msk:  Symmetrical without gross deformities. Normal posture. Extremities:  Without edema. Neurologic:  Alert and   oriented x4;  grossly normal neurologically. Skin:  Intact without significant lesions or rashes. Psych:  Alert and cooperative. Normal mood and affect.   Assessment   Leslie Berry is an 83 y.o. female presenting today at the request of [facility] due to acute onset rectal bleeding 10/12. Hgb 13.7 in ED. She also has a hx of  PBC, previously followed by Dr. Kristie in [place] at [facility]. ED consulted with on call GI (Dr Cindie), who recommended urgent office visit.   Suspect diverticular bleed, less likely ischemic colitis, now resolving. Will update CBC today to ensure this is stable and arrange colonoscopy in near future as it has been many years since prior.   PBC: on Urso  BID. Update US  abdomen. Can discuss this at next visit.      PLAN   Recheck CBC Proceed with colonoscopy by Dr. Cindie  in near future: the risks, benefits, and alternatives have been discussed with the patient in detail. The patient states understanding and desires to proceed.  RUQ US  due to hx of PBC Follow-up in 2 months to discuss PBC  Therisa MICAEL Stager, PhD, ANP-BC [facility]   ADDENDUM: Upon review of chart after patient left, I was able to look in Care everywhere and saw that she had been seen earlier this month on 04/24/2024 by Dr. Kristie and is actively being followed there. At this visit, she did not report this and I suspect may have been confused regarding continuity of care.  She has a colonoscopy arranged in near future with Dr Cindie, as that note was not available to me till most recently when I looked in Care EVerywhere at time of visit (it looked like it had been several years since being seen by her primary GI).  US  that I ordered showed nodular contour. Platelets 236.   It would be best if she continues her care with Dr Kristie as was recently seen 10/2, even prior to seeing us  here. Will reach out to patient and inform her of this. If it is more convenient to keep care here,  she will need to make that decision and choose one primary GI for moving forward.   Therisa MICAEL Stager, PhD, ANP-BC [facility]

## 2024-05-07 NOTE — H&P (View-Only) (Signed)
 Gastroenterology Office Note    Referring Provider: Shona Norleen PEDLAR, MD Primary Care Physician:  Shona Norleen PEDLAR, MD  Primary GI: Dr [facility]   Chief Complaint   Chief Complaint  Patient presents with   Rectal Bleeding    Pt arrives for hospital follow up. Pt was at [facility] on 05/04/24 due to rectal bleeding. Pt states no bleeding at this time.      History of Present Illness   Leslie Berry is an 83 y.o. female presenting today at the request of [facility] due to acute onset rectal bleeding 10/12. Hgb 13.7 in ED. She also has a hx of PBC, previously followed by Dr. Kristie in [place] at [facility]. She also has a hx of PBC and on Urso  BID. ED consulted with on call GI (Dr Cindie), who recommended urgent office visit.   Dark burgundy blood on Sunday, large amount, went to ED, happened again at home. Last episode of bleeding Monday. Will see a little bit of blood on pad. Back was hurting last week and took tylenol , no NSAIDs. No rectal pain.   Hgb 13.7 in ED.   Bowels are hard. Usually BID BM. Mild straining. Doesn't take anything OTC. If eats fruits, will have softer stool. No weight loss or lack of appetite.   Omeprazole daily. No dysphagia. Urso  BID  Last colonoscopy at Dr. Nola office in [place], about 13 years ago. Possible polyps.  Last EGD at same time.    Unsure if daughter had colon cancer or not but had some type of cancer  Patient does not believe she has been seen by primary GI in awhile and agreeable to have colonoscopy here and continue care here.    Past Medical History:  Diagnosis Date   Arthritis    Diabetes mellitus    Elevated LFTs    GERD (gastroesophageal reflux disease)    Gout    Hypertension    dr bland  pcp   Osteoporosis     Past Surgical History:  Procedure Laterality Date   CARPAL TUNNEL RELEASE     CARPAL TUNNEL RELEASE  12/22/2011   Procedure: CARPAL TUNNEL RELEASE;  Surgeon: Rome JULIANNA Pepper, MD;   Location: [facility] OR;  Service: Orthopedics;  Laterality: Right;   CESAREAN SECTION     HAND SURGERY Right 10/2023   ROTATOR CUFF REPAIR      Current Outpatient Medications  Medication Sig Dispense Refill   acetaminophen  (TYLENOL ) 500 MG tablet Take 1,000 mg by mouth every 12 (twelve) hours as needed. Arthritis     alendronate (FOSAMAX) 70 MG tablet Take 70 mg by mouth once a week.     allopurinol (ZYLOPRIM) 100 MG tablet Take 100 mg by mouth daily.     amLODipine  (NORVASC ) 10 MG tablet Take 10 mg by mouth daily.     cinacalcet  (SENSIPAR ) 30 MG tablet TAKE 1 TABS BY MOUTH 2 (TWO) TIMES DAILY WITH A MEAL. 1 DAILY WITH BREAKFAST AND 1 DAILY WITH SUPPER 180 tablet 1   colchicine  0.6 MG tablet Take 0.6 mg by mouth daily.     FARXIGA 10 MG TABS tablet Take 10 mg by mouth daily.     Magnesium  250 MG TABS Take 1 tablet (250 mg total) by mouth daily with breakfast. 90 tablet 1   omeprazole (PRILOSEC) 20 MG capsule Take 20 mg by mouth daily.     ursodiol  (ACTIGALL ) 500 MG tablet Take 500 mg  by mouth in the morning and at bedtime.      aspirin EC 81 MG tablet Take 81 mg by mouth daily. Swallow whole. (Patient not taking: Reported on 05/07/2024)     No current facility-administered medications for this visit.    Allergies as of 05/07/2024   (No Known Allergies)    Family History  Problem Relation Age of Onset   Thyroid  disease Mother    Hypertension Mother    Hyperlipidemia Mother    Stroke Father    Cancer Sister    Hypertension Maternal Grandmother    Hypertension Maternal Grandfather    Anesthesia problems Neg Hx     Social History   Socioeconomic History   Marital status: Widowed    Spouse name: Not on file   Number of children: Not on file   Years of education: Not on file   Highest education level: Not on file  Occupational History   Not on file  Tobacco Use   Smoking status: Former    Current packs/day: 0.00    Types: Cigarettes    Quit date: 05/15/1983    Years since  quitting: 41.0   Smokeless tobacco: Never  Vaping Use   Vaping status: Never Used  Substance and Sexual Activity   Alcohol use: No   Drug use: No   Sexual activity: Not Currently    Birth control/protection: Post-menopausal  Other Topics Concern   Not on file  Social History Narrative   Not on file   Social Drivers of Health   Financial Resource Strain: Not on file  Food Insecurity: Not on file  Transportation Needs: Not on file  Physical Activity: Not on file  Stress: Not on file  Social Connections: Not on file  Intimate Partner Violence: Not on file     Review of Systems   Gen: Denies any fever, chills, fatigue, weight loss, lack of appetite.  CV: Denies chest pain, heart palpitations, peripheral edema, syncope.  Resp: Denies shortness of breath at rest or with exertion. Denies wheezing or cough.  GI: Denies dysphagia or odynophagia. Denies jaundice, hematemesis, fecal incontinence. GU : Denies urinary burning, urinary frequency, urinary hesitancy MS: Denies joint pain, muscle weakness, cramps, or limitation of movement.  Derm: Denies rash, itching, dry skin Psych: Denies depression, anxiety, memory loss, and confusion Heme: Denies bruising, bleeding, and enlarged lymph nodes.   Physical Exam   BP 133/80   Pulse 79   Temp 98 F (36.7 C)   Ht 5' 5 (1.651 m)   Wt 143 lb 6.4 oz (65 kg)   BMI 23.86 kg/m  General:   Alert and oriented. Pleasant and cooperative. Well-nourished and well-developed.  Head:  Normocephalic and atraumatic. Eyes:  Without icterus Ears:  hard of hearing: wears hearing aids Lungs:  Clear to auscultation bilaterally.  Heart:  S1, S2 present without murmurs appreciated.  Abdomen:  +BS, soft, non-tender and non-distended. No HSM noted. No guarding or rebound. No masses appreciated.  Rectal:  Deferred as recently done in the ED Msk:  Symmetrical without gross deformities. Normal posture. Extremities:  Without edema. Neurologic:  Alert and   oriented x4;  grossly normal neurologically. Skin:  Intact without significant lesions or rashes. Psych:  Alert and cooperative. Normal mood and affect.   Assessment   Leslie Berry is an 83 y.o. female presenting today at the request of [facility] due to acute onset rectal bleeding 10/12. Hgb 13.7 in ED. She also has a hx of  PBC, previously followed by Dr. Kristie in [place] at [facility]. ED consulted with on call GI (Dr Cindie), who recommended urgent office visit.   Suspect diverticular bleed, less likely ischemic colitis, now resolving. Will update CBC today to ensure this is stable and arrange colonoscopy in near future as it has been many years since prior.   PBC: on Urso  BID. Update US  abdomen. Can discuss this at next visit.      PLAN   Recheck CBC Proceed with colonoscopy by Dr. Cindie  in near future: the risks, benefits, and alternatives have been discussed with the patient in detail. The patient states understanding and desires to proceed.  RUQ US  due to hx of PBC Follow-up in 2 months to discuss PBC  Therisa MICAEL Stager, PhD, ANP-BC [facility]   ADDENDUM: Upon review of chart after patient left, I was able to look in Care everywhere and saw that she had been seen earlier this month on 04/24/2024 by Dr. Kristie and is actively being followed there. At this visit, she did not report this and I suspect may have been confused regarding continuity of care.  She has a colonoscopy arranged in near future with Dr Cindie, as that note was not available to me till most recently when I looked in Care EVerywhere at time of visit (it looked like it had been several years since being seen by her primary GI).  US  that I ordered showed nodular contour. Platelets 236.   It would be best if she continues her care with Dr Kristie as was recently seen 10/2, even prior to seeing us  here. Will reach out to patient and inform her of this. If it is more convenient to keep care here,  she will need to make that decision and choose one primary GI for moving forward.   Therisa MICAEL Stager, PhD, ANP-BC [facility]

## 2024-05-07 NOTE — Patient Instructions (Signed)
 Please have blood count checked today.  We are arranging a colonoscopy with Dr Cindie in the near future!  I recommend adding Benefiber 2 teaspoons each day to the beverage of your choice to help with better bowel movements.   We will see you back in follow-up to talk about the history of liver disease and make sure you are doing well from that standpoint! We will also order an ultrasound of your liver as this needs to be updated.   It was a pleasure to see you today. I want to create trusting relationships with patients and provide genuine, compassionate, and quality care. I truly value your feedback, so please be on the lookout for a survey regarding your visit with me today. I appreciate your time in completing this!         Therisa MICAEL Stager, PhD, ANP-BC [facility]

## 2024-05-08 LAB — CBC WITH DIFFERENTIAL/PLATELET
Basophils Absolute: 0.1 x10E3/uL (ref 0.0–0.2)
Basos: 1 %
EOS (ABSOLUTE): 0.1 x10E3/uL (ref 0.0–0.4)
Eos: 3 %
Hematocrit: 34.8 % (ref 34.0–46.6)
Hemoglobin: 11.2 g/dL (ref 11.1–15.9)
Immature Grans (Abs): 0 x10E3/uL (ref 0.0–0.1)
Immature Granulocytes: 0 %
Lymphocytes Absolute: 2.4 x10E3/uL (ref 0.7–3.1)
Lymphs: 44 %
MCH: 31.8 pg (ref 26.6–33.0)
MCHC: 32.2 g/dL (ref 31.5–35.7)
MCV: 99 fL — ABNORMAL HIGH (ref 79–97)
Monocytes Absolute: 0.5 x10E3/uL (ref 0.1–0.9)
Monocytes: 10 %
Neutrophils Absolute: 2.2 x10E3/uL (ref 1.4–7.0)
Neutrophils: 42 %
Platelets: 236 x10E3/uL (ref 150–450)
RBC: 3.52 x10E6/uL — ABNORMAL LOW (ref 3.77–5.28)
RDW: 12.2 % (ref 11.7–15.4)
WBC: 5.3 x10E3/uL (ref 3.4–10.8)

## 2024-05-09 ENCOUNTER — Ambulatory Visit: Payer: Self-pay | Admitting: Gastroenterology

## 2024-05-09 NOTE — Telephone Encounter (Signed)
 Left daughter Madalynn a vm with pre-op details for patient's colonoscopy.

## 2024-05-13 ENCOUNTER — Ambulatory Visit (HOSPITAL_COMMUNITY)
Admission: RE | Admit: 2024-05-13 | Discharge: 2024-05-13 | Disposition: A | Discharge status: Home / Self Care | Source: Ambulatory Visit | Attending: Gastroenterology | Admitting: Gastroenterology

## 2024-05-13 DIAGNOSIS — Z0001 Encounter for general adult medical examination with abnormal findings: Secondary | ICD-10-CM | POA: Diagnosis not present

## 2024-05-13 DIAGNOSIS — K743 Primary biliary cirrhosis: Secondary | ICD-10-CM | POA: Insufficient documentation

## 2024-05-13 DIAGNOSIS — Z23 Encounter for immunization: Secondary | ICD-10-CM | POA: Diagnosis not present

## 2024-05-13 DIAGNOSIS — Z0000 Encounter for general adult medical examination without abnormal findings: Secondary | ICD-10-CM | POA: Diagnosis not present

## 2024-05-13 DIAGNOSIS — M545 Low back pain, unspecified: Secondary | ICD-10-CM | POA: Diagnosis not present

## 2024-05-13 DIAGNOSIS — G4700 Insomnia, unspecified: Secondary | ICD-10-CM | POA: Diagnosis not present

## 2024-05-13 DIAGNOSIS — E213 Hyperparathyroidism, unspecified: Secondary | ICD-10-CM | POA: Diagnosis not present

## 2024-05-13 DIAGNOSIS — K7460 Unspecified cirrhosis of liver: Secondary | ICD-10-CM | POA: Diagnosis not present

## 2024-05-13 DIAGNOSIS — R252 Cramp and spasm: Secondary | ICD-10-CM | POA: Diagnosis not present

## 2024-05-13 DIAGNOSIS — I10 Essential (primary) hypertension: Secondary | ICD-10-CM | POA: Diagnosis not present

## 2024-05-13 DIAGNOSIS — K7689 Other specified diseases of liver: Secondary | ICD-10-CM | POA: Diagnosis not present

## 2024-05-13 DIAGNOSIS — K219 Gastro-esophageal reflux disease without esophagitis: Secondary | ICD-10-CM | POA: Diagnosis not present

## 2024-05-13 DIAGNOSIS — E1122 Type 2 diabetes mellitus with diabetic chronic kidney disease: Secondary | ICD-10-CM | POA: Diagnosis not present

## 2024-05-13 DIAGNOSIS — R002 Palpitations: Secondary | ICD-10-CM | POA: Diagnosis not present

## 2024-05-24 ENCOUNTER — Other Ambulatory Visit (INDEPENDENT_AMBULATORY_CARE_PROVIDER_SITE_OTHER): Payer: Self-pay | Admitting: Gastroenterology

## 2024-05-25 ENCOUNTER — Telehealth: Payer: Self-pay | Admitting: Gastroenterology

## 2024-05-25 NOTE — Telephone Encounter (Signed)
 Upon review of chart after patient left, I was able to look in Care everywhere and saw that she had been seen earlier this month on 04/24/2024 by Dr. Kristie (GI in [place]) and is actively being followed there. At this last  visit, she did not report this and I suspect may have been confused regarding continuity of care with one GI.   She has a colonoscopy arranged this week with Dr Cindie, as that note was not available to me till most recently when I looked in Care EVerywhere at time of visit (it looked like it had been several years since being seen by her primary GI).  US  that I ordered showed nodular contour. Platelets 236.   It would be best if she continues her care with Dr Kristie as was recently seen 10/2, even prior to seeing us  here. Please reach out to patient and inform her of this. If it is more convenient to keep care here, she will need to make that decision and choose one primary GI for moving forward.   Therisa MICAEL Stager, PhD, ANP-BC [facility]

## 2024-05-26 ENCOUNTER — Encounter (HOSPITAL_COMMUNITY)
Admission: RE | Admit: 2024-05-26 | Discharge: 2024-05-26 | Disposition: A | Discharge status: Home / Self Care | Source: Ambulatory Visit | Attending: Internal Medicine | Admitting: Internal Medicine

## 2024-05-26 ENCOUNTER — Other Ambulatory Visit: Payer: Self-pay

## 2024-05-26 ENCOUNTER — Encounter (HOSPITAL_COMMUNITY): Payer: Self-pay

## 2024-05-26 NOTE — Telephone Encounter (Signed)
 FYIBETHA Therisa Crease, Tammy and Mindy  Phoned and advised the pt of the above and the pt stated she needed more time to think about this because she likes Dr Kristie but being in [place] @ a GI is more convenient for her. She ask that I call her back tomorrow to give her time to think about this. Will call back tomorrow

## 2024-05-26 NOTE — Telephone Encounter (Signed)
 Called [facility] was advised not sure why we received this. Patient has already picked her prep up.

## 2024-05-26 NOTE — Telephone Encounter (Signed)
 Mindy and Tammy, please see below. FYI as may need to cancel.

## 2024-05-30 ENCOUNTER — Ambulatory Visit (HOSPITAL_COMMUNITY)
Admission: RE | Admit: 2024-05-30 | Discharge: 2024-05-30 | Disposition: A | Discharge status: Home / Self Care | Attending: Internal Medicine | Admitting: Internal Medicine

## 2024-05-30 ENCOUNTER — Encounter (HOSPITAL_COMMUNITY)
Admission: RE | Disposition: A | Discharge status: Home / Self Care | Payer: Self-pay | Source: Home / Self Care | Attending: Internal Medicine

## 2024-05-30 ENCOUNTER — Ambulatory Visit (HOSPITAL_COMMUNITY): Admitting: Certified Registered Nurse Anesthetist

## 2024-05-30 ENCOUNTER — Other Ambulatory Visit: Payer: Self-pay | Admitting: Internal Medicine

## 2024-05-30 ENCOUNTER — Encounter (HOSPITAL_COMMUNITY): Payer: Self-pay | Admitting: Internal Medicine

## 2024-05-30 DIAGNOSIS — K6289 Other specified diseases of anus and rectum: Secondary | ICD-10-CM | POA: Diagnosis not present

## 2024-05-30 DIAGNOSIS — Z79899 Other long term (current) drug therapy: Secondary | ICD-10-CM | POA: Insufficient documentation

## 2024-05-30 DIAGNOSIS — K635 Polyp of colon: Secondary | ICD-10-CM | POA: Diagnosis not present

## 2024-05-30 DIAGNOSIS — D123 Benign neoplasm of transverse colon: Secondary | ICD-10-CM

## 2024-05-30 DIAGNOSIS — K5730 Diverticulosis of large intestine without perforation or abscess without bleeding: Secondary | ICD-10-CM

## 2024-05-30 DIAGNOSIS — Z7984 Long term (current) use of oral hypoglycemic drugs: Secondary | ICD-10-CM | POA: Diagnosis not present

## 2024-05-30 DIAGNOSIS — K648 Other hemorrhoids: Secondary | ICD-10-CM | POA: Diagnosis not present

## 2024-05-30 DIAGNOSIS — I10 Essential (primary) hypertension: Secondary | ICD-10-CM | POA: Insufficient documentation

## 2024-05-30 DIAGNOSIS — K625 Hemorrhage of anus and rectum: Secondary | ICD-10-CM | POA: Diagnosis not present

## 2024-05-30 DIAGNOSIS — Z87891 Personal history of nicotine dependence: Secondary | ICD-10-CM | POA: Diagnosis not present

## 2024-05-30 DIAGNOSIS — M1990 Unspecified osteoarthritis, unspecified site: Secondary | ICD-10-CM | POA: Insufficient documentation

## 2024-05-30 DIAGNOSIS — E119 Type 2 diabetes mellitus without complications: Secondary | ICD-10-CM | POA: Insufficient documentation

## 2024-05-30 DIAGNOSIS — K219 Gastro-esophageal reflux disease without esophagitis: Secondary | ICD-10-CM | POA: Insufficient documentation

## 2024-05-30 HISTORY — PX: COLONOSCOPY: SHX5424

## 2024-05-30 LAB — GLUCOSE, CAPILLARY: Glucose-Capillary: 87 mg/dL (ref 70–99)

## 2024-05-30 SURGERY — COLONOSCOPY
Anesthesia: General

## 2024-05-30 MED ORDER — LACTATED RINGERS IV SOLN: INTRAVENOUS | Status: DC

## 2024-05-30 MED ORDER — PROPOFOL 500 MG/50ML IV EMUL
INTRAVENOUS | Status: DC | PRN
  Administered 2024-05-30: 50 mg via INTRAVENOUS
  Administered 2024-05-30: 100 ug/kg/min via INTRAVENOUS

## 2024-05-30 MED ORDER — LACTATED RINGERS IV SOLN: INTRAVENOUS | Status: DC | PRN

## 2024-05-30 NOTE — Op Note (Addendum)
 [facility] Patient Name: Leslie Berry Procedure Date: 05/30/2024 8:23 AM MRN: [id number] Date of Birth: [birth year] Attending MD: Carlin POUR. Cindie , [place], [id number] CSN: [id number] Age: 83 Admit Type: Outpatient Procedure:                Colonoscopy Indications:              Rectal bleeding Providers:                Carlin POUR. Cindie, DO, Leandrew Edelman RN, RN, Bascom Blush Referring MD:              Medicines:                See the Anesthesia note for documentation of the                            administered medications Complications:            No immediate complications. Estimated Blood Loss:     Estimated blood loss was minimal. Procedure:                Pre-Anesthesia Assessment:                           - The anesthesia plan was to use monitored                            anesthesia care (MAC).                           After obtaining informed consent, the colonoscope                            was passed under direct vision. Throughout the                            procedure, the patient's blood pressure, pulse, and                            oxygen saturations were monitored continuously. The                            PCF-HQ190L ([id number]) Peds Colon was introduced                            through the anus and advanced to the the cecum,                            identified by appendiceal orifice and ileocecal                            valve. The colonoscopy was performed without                            difficulty. The patient tolerated the procedure  well. The quality of the bowel preparation was                            evaluated using the BBPS [facility] Bowel Preparation                            Scale) with scores of: Right Colon = 3, Transverse                            Colon = 3 and Left Colon = 3 (entire mucosa seen                            well with no residual staining, small fragments of                             stool or opaque liquid). The total BBPS score                            equals 9. Scope In: 8:38:05 AM Scope Out: 8:53:04 AM Scope Withdrawal Time: 0 hours 10 minutes 2 seconds  Total Procedure Duration: 0 hours 14 minutes 59 seconds  Findings:      The digital rectal exam revealed a hard rectal mass.      Prolapsed internal Hemorrhoid was found on perianal exam.      Non-bleeding internal hemorrhoids were found during retroflexion.      A submucosal non-obstructing medium-sized mass vs extrinsic compression       was found in the rectum. Hard to palpation.      Multiple medium-mouthed and small-mouthed diverticula were found in the       sigmoid colon and descending colon.      An 8 mm polyp was found in the transverse colon. The polyp was       pedunculated. The polyp was removed with a cold snare. Resection and       retrieval were complete. Impression:               - Rectal mass.                           - Hemorrhoids found on perianal exam.                           - Non-bleeding internal hemorrhoids.                           - Tumor in the rectum.                           - Diverticulosis in the sigmoid colon and in the                            descending colon.                           - One 8 mm polyp in the transverse colon, removed  with a cold snare. Resected and retrieved. Moderate Sedation:      Per Anesthesia Care Recommendation:           - Patient has a contact number available for                            emergencies. The signs and symptoms of potential                            delayed complications were discussed with the                            patient. Return to normal activities tomorrow.                            Written discharge instructions were provided to the                            patient.                           - Resume previous diet.                           - Continue present  medications.                           - Await pathology results.                           - Return to GI clinic in 4 weeks.                           - Discussed further in postop with patient. She has                            a vaginal pessary device currently in place. This                            is likely the submucosal lesion seen on colonoscopy                            today. She is unable to remove currently. Follow-up                            in GI office in 4 weeks to discuss hemorrhoid                            banding. Hopefully she will be able to remove the                            device prior to OV and we can repeat rectal exam to  ensure no mass felt. Procedure Code(s):        --- Professional ---                           [id number], Colonoscopy, flexible; with removal of                            tumor(s), polyp(s), or other lesion(s) by snare                            technique Diagnosis Code(s):        --- Professional ---                           K62.89, Other specified diseases of anus and rectum                           D49.0, Neoplasm of unspecified behavior of                            digestive system                           K64.8, Other hemorrhoids                           D12.3, Benign neoplasm of transverse colon (hepatic                            flexure or splenic flexure)                           K62.5, Hemorrhage of anus and rectum                           K57.30, Diverticulosis of large intestine without                            perforation or abscess without bleeding CPT copyright 2022 American Medical Association. All rights reserved. The codes documented in this report are preliminary and upon coder review may  be revised to meet current compliance requirements. Carlin POUR. Cindie, DO Carlin POUR. Cindie, DO 05/30/2024 9:03:22 AM This report has been signed electronically. Number of Addenda: 0

## 2024-05-30 NOTE — Discharge Instructions (Addendum)
  Colonoscopy Discharge Instructions  Read the instructions outlined below and refer to this sheet in the next few weeks. These discharge instructions provide you with general information on caring for yourself after you leave the hospital. Your doctor may also give you specific instructions. While your treatment has been planned according to the most current medical practices available, unavoidable complications occasionally occur.   ACTIVITY You may resume your regular activity, but move at a slower pace for the next 24 hours.  Take frequent rest periods for the next 24 hours.  Walking will help get rid of the air and reduce the bloated feeling in your belly (abdomen).  No driving for 24 hours (because of the medicine (anesthesia) used during the test).   Do not sign any important legal documents or operate any machinery for 24 hours (because of the anesthesia used during the test).  NUTRITION Drink plenty of fluids.  You may resume your normal diet as instructed by your doctor.  Begin with a light meal and progress to your normal diet. Heavy or fried foods are harder to digest and may make you feel sick to your stomach (nauseated).  Avoid alcoholic beverages for 24 hours or as instructed.  MEDICATIONS You may resume your normal medications unless your doctor tells you otherwise.  WHAT YOU CAN EXPECT TODAY Some feelings of bloating in the abdomen.  Passage of more gas than usual.  Spotting of blood in your stool or on the toilet paper.  IF YOU HAD POLYPS REMOVED DURING THE COLONOSCOPY: No aspirin products for 7 days or as instructed.  No alcohol for 7 days or as instructed.  Eat a soft diet for the next 24 hours.  FINDING OUT THE RESULTS OF YOUR TEST Not all test results are available during your visit. If your test results are not back during the visit, make an appointment with your caregiver to find out the results. Do not assume everything is normal if you have not heard from your  caregiver or the medical facility. It is important for you to follow up on all of your test results.  SEEK IMMEDIATE MEDICAL ATTENTION IF: You have more than a spotting of blood in your stool.  Your belly is swollen (abdominal distention).  You are nauseated or vomiting.  You have a temperature over 101.  You have abdominal pain or discomfort that is severe or gets worse throughout the day.   Your colonoscopy revealed 1 polyp(s) which I removed successfully. Await pathology results, my office will contact you.   You also had a submucosal mass in your rectum.  This was quite hard on palpation.  I am going to order an MRI to further evaluate, next steps.  You also have diverticulosis and internal hemorrhoids. I would recommend increasing fiber in your diet or adding OTC Benefiber/Metamucil. Be sure to drink at least 4 to 6 glasses of water daily.   Follow-up with GI in 4 weeks   I hope you have a great rest of your week!  Carlin POUR. Cindie, D.O. Gastroenterology and Hepatology [facility]

## 2024-05-30 NOTE — Anesthesia Preprocedure Evaluation (Addendum)
 Anesthesia Evaluation  Patient identified by MRN, date of birth, ID band Patient awake    Reviewed: Allergy & Precautions, H&P , NPO status , Patient's Chart, lab work & pertinent test results, reviewed documented beta blocker date and time   Airway Mallampati: II  TM Distance: >3 FB Neck ROM: full    Dental  (+) Dental Advisory Given, Caps All upper front are crowns:   Pulmonary former smoker   Pulmonary exam normal breath sounds clear to auscultation       Cardiovascular Exercise Tolerance: Good hypertension, Normal cardiovascular exam Rhythm:regular Rate:Normal     Neuro/Psych negative neurological ROS  negative psych ROS   GI/Hepatic Neg liver ROS,GERD  ,,Elevated LFTs   Endo/Other  diabetes, Type 2    Renal/GU negative Renal ROS  negative genitourinary   Musculoskeletal  (+) Arthritis , Osteoarthritis,    Abdominal   Peds  Hematology negative hematology ROS (+)   Anesthesia Other Findings   Reproductive/Obstetrics negative OB ROS                              Anesthesia Physical Anesthesia Plan  ASA: 2  Anesthesia Plan: General   Post-op Pain Management: Minimal or no pain anticipated   Induction: Intravenous  PONV Risk Score and Plan: Propofol  infusion  Airway Management Planned: Natural Airway and Nasal Cannula  Additional Equipment: None  Intra-op Plan:   Post-operative Plan:   Informed Consent: I have reviewed the patients History and Physical, chart, labs and discussed the procedure including the risks, benefits and alternatives for the proposed anesthesia with the patient or authorized representative who has indicated his/her understanding and acceptance.     Dental Advisory Given  Plan Discussed with: CRNA  Anesthesia Plan Comments:          Anesthesia Quick Evaluation

## 2024-05-30 NOTE — Anesthesia Postprocedure Evaluation (Signed)
 Anesthesia Post Note  Patient: Leslie Berry  Procedure(s) Performed: COLONOSCOPY  Patient location during evaluation: Phase II Anesthesia Type: General Level of consciousness: awake and alert Pain management: pain level controlled Vital Signs Assessment: post-procedure vital signs reviewed and stable Respiratory status: spontaneous breathing, nonlabored ventilation and respiratory function stable Cardiovascular status: stable Anesthetic complications: no   There were no known notable events for this encounter.   Last Vitals:  Vitals:   05/30/24 0857 05/30/24 0902  BP: 94/71 103/61  Pulse: 65   Resp: 17   Temp: 36.5 C   SpO2: 99%     Last Pain:  Vitals:   05/30/24 0857  TempSrc: Oral  PainSc:                  Carlin LITTIE Kawasaki

## 2024-05-30 NOTE — Telephone Encounter (Signed)
Pt already had procedure done.

## 2024-05-30 NOTE — Interval H&P Note (Signed)
 History and Physical Interval Note:  05/30/2024 8:27 AM  Leslie Berry  has presented today for surgery, with the diagnosis of rectal bleeding.  The various methods of treatment have been discussed with the patient and family. After consideration of risks, benefits and other options for treatment, the patient has consented to  Procedure(s) with comments: COLONOSCOPY (N/A) - 8:30am, ASA 2 as a surgical intervention.  The patient's history has been reviewed, patient examined, no change in status, stable for surgery.  I have reviewed the patient's chart and labs.  Questions were answered to the patient's satisfaction.     Carlin MARLA Hasty

## 2024-05-30 NOTE — Transfer of Care (Signed)
 Immediate Anesthesia Transfer of Care Note  Patient: Leslie Berry  Procedure(s) Performed: COLONOSCOPY  Patient Location: Short Stay  Anesthesia Type:General  Level of Consciousness: drowsy  Airway & Oxygen Therapy: Patient Spontanous Breathing  Post-op Assessment: Report given to RN and Post -op Vital signs reviewed and stable  Post vital signs: Reviewed and stable  Last Vitals:  Vitals Value Taken Time  BP 103/61 05/30/24 09:02  Temp 36.5 C 05/30/24 08:57  Pulse 65 05/30/24 08:57  Resp 17 05/30/24 08:57  SpO2 99 % 05/30/24 08:57    Last Pain:  Vitals:   05/30/24 0857  TempSrc: Oral  PainSc:          Complications: No notable events documented.

## 2024-05-30 NOTE — Telephone Encounter (Signed)
 Phoned the pt and she states that she will continue to come here instead of being seen by Dr Kristie

## 2024-06-02 ENCOUNTER — Encounter (HOSPITAL_COMMUNITY): Payer: Self-pay | Admitting: Internal Medicine

## 2024-06-02 LAB — SURGICAL PATHOLOGY

## 2024-07-09 ENCOUNTER — Ambulatory Visit: Admitting: Gastroenterology

## 2024-07-09 ENCOUNTER — Encounter: Payer: Self-pay | Admitting: Gastroenterology

## 2024-07-09 VITALS — BP 136/81 | HR 102 | Temp 97.8°F | Ht 65.0 in | Wt 145.5 lb

## 2024-07-09 DIAGNOSIS — K743 Primary biliary cirrhosis: Secondary | ICD-10-CM

## 2024-07-09 NOTE — Progress Notes (Signed)
 "   Gastroenterology Office Note     Primary Care Physician:  Shona Norleen PEDLAR, MD  Primary Gastroenterologist: Dr Cindie   Chief Complaint   Chief Complaint  Patient presents with   Follow-up    Patient here today for a follow up on rectal bleeding. Patient states she is no longer having issues with rectal bleeding.Patient denies any current gi related issues.      History of Present Illness   Leslie Berry is an 83 y.o. female presenting today with a history of PBC, previously followed by Dr. Kristie in [place] at [facility], chronic GERD, history of rectal bleeding with recent colonoscopy completed in interim from last visit. Found to have cirrhosis on recent US , which is new finding.   Rectal bleeding likely due to hemorrhoids. This has resolved. No abdominal pain, nausea, vomiting. No dysphagia. No mental status changes or confusion. No jaundice or pruritus.   RUQ US : nodular liver contour. CBD 5.4 mm.   Urso  500 mg BID. Alk phos normal, transaminases normal, T bili 0.4. Discussed in detail about PBC and new findings of cirrhosis. She is unsure if she wants to have EGD for variceal screening.   Colonoscopy Nov 2025: prolapsed internal hemorrhoid, internalh emorrhoids, submucosal non-obstructing medium-sized mass vs extrinsic compression in rectum, multiple diverticula, 8 mm polyp in transverse colon, s/p removal. Vaginal pessary in place. Tubular adenoma. Felt that extrinsic compression was due to vaginal pessary.   DEXA scan on file May 2025: osteoporosis. Takes Fosamax.      Past Medical History:  Diagnosis Date   Arthritis    Diabetes mellitus    Elevated LFTs    GERD (gastroesophageal reflux disease)    Gout    Hypertension    dr bland  pcp   Osteoporosis     Past Surgical History:  Procedure Laterality Date   CARPAL TUNNEL RELEASE     CARPAL TUNNEL RELEASE  12/22/2011   Procedure: CARPAL TUNNEL RELEASE;  Surgeon: Rome JULIANNA Pepper, MD;  Location: [facility] OR;   Service: Orthopedics;  Laterality: Right;   CESAREAN SECTION     COLONOSCOPY N/A 05/30/2024   Procedure: COLONOSCOPY;  Surgeon: Cindie Carlin POUR, DO;  Location: [facility];  Service: Endoscopy;  Laterality: N/A;  8:30am, ASA 2   HAND SURGERY Right 10/2023   ROTATOR CUFF REPAIR Right     Current Outpatient Medications  Medication Sig Dispense Refill   acetaminophen  (TYLENOL ) 500 MG tablet Take 1,000 mg by mouth every 12 (twelve) hours as needed. Arthritis     alendronate (FOSAMAX) 70 MG tablet Take 70 mg by mouth once a week.     allopurinol (ZYLOPRIM) 100 MG tablet Take 100 mg by mouth daily.     amLODipine  (NORVASC ) 10 MG tablet Take 10 mg by mouth daily.     cinacalcet  (SENSIPAR ) 30 MG tablet TAKE 1 TABS BY MOUTH 2 (TWO) TIMES DAILY WITH A MEAL. 1 DAILY WITH BREAKFAST AND 1 DAILY WITH SUPPER 180 tablet 1   colchicine  0.6 MG tablet Take 0.6 mg by mouth daily.     Magnesium  250 MG TABS Take 1 tablet (250 mg total) by mouth daily with breakfast. 90 tablet 1   omeprazole (PRILOSEC) 20 MG capsule Take 20 mg by mouth daily.     ursodiol  (ACTIGALL ) 500 MG tablet Take 500 mg by mouth in the morning and at bedtime.      aspirin EC 81 MG tablet Take 81 mg by mouth daily. Swallow  whole. (Patient not taking: Reported on 07/09/2024)     FARXIGA 10 MG TABS tablet Take 10 mg by mouth daily.     No current facility-administered medications for this visit.    Allergies as of 07/09/2024   (No Known Allergies)    Family History  Problem Relation Age of Onset   Thyroid  disease Mother    Hypertension Mother    Hyperlipidemia Mother    Stroke Father    Cancer Sister    Hypertension Maternal Grandmother    Hypertension Maternal Grandfather    Anesthesia problems Neg Hx    Colon cancer Neg Hx     Social History   Socioeconomic History   Marital status: Widowed    Spouse name: Not on file   Number of children: Not on file   Years of education: Not on file   Highest education level: Not  on file  Occupational History   Not on file  Tobacco Use   Smoking status: Former    Current packs/day: 0.00    Types: Cigarettes    Quit date: 05/15/1983    Years since quitting: 41.1   Smokeless tobacco: Never  Vaping Use   Vaping status: Never Used  Substance and Sexual Activity   Alcohol use: No   Drug use: No   Sexual activity: Not Currently    Birth control/protection: Post-menopausal  Other Topics Concern   Not on file  Social History Narrative   Not on file   Social Drivers of Health   Tobacco Use: Medium Risk (07/09/2024)   Patient History    Smoking Tobacco Use: Former    Smokeless Tobacco Use: Never    Passive Exposure: Not on Actuary Strain: Not on file  Food Insecurity: Not on file  Transportation Needs: Not on file  Physical Activity: Not on file  Stress: Not on file  Social Connections: Not on file  Intimate Partner Violence: Not on file  Depression (EYV7-0): Not on file  Alcohol Screen: Not on file  Housing: Not on file  Utilities: Not on file  Health Literacy: Not on file     Review of Systems   Gen: Denies any fever, chills, fatigue, weight loss, lack of appetite.  CV: Denies chest pain, heart palpitations, peripheral edema, syncope.  Resp: Denies shortness of breath at rest or with exertion. Denies wheezing or cough.  GI: Denies dysphagia or odynophagia. Denies jaundice, hematemesis, fecal incontinence. GU : Denies urinary burning, urinary frequency, urinary hesitancy MS: Denies joint pain, muscle weakness, cramps, or limitation of movement.  Derm: Denies rash, itching, dry skin Psych: Denies depression, anxiety, memory loss, and confusion Heme: Denies bruising, bleeding, and enlarged lymph nodes.   Physical Exam   BP 136/81 (BP Location: Left Arm, Patient Position: Sitting, Cuff Size: Normal)   Pulse (!) 102   Temp 97.8 F (36.6 C) (Temporal)   Ht 5' 5 (1.651 m)   Wt 145 lb 8 oz (66 kg)   BMI 24.21 kg/m  General:    Alert and oriented. Pleasant and cooperative. Well-nourished and well-developed.  Head:  Normocephalic and atraumatic. Eyes:  Without icterus Abdomen:  +BS, soft, non-tender and non-distended. No HSM noted. No guarding or rebound. No masses appreciated.  Rectal:  Deferred  Msk:  Symmetrical without gross deformities. Normal posture. Extremities:  Without edema. Neurologic:  Alert and  oriented x4;  grossly normal neurologically. Skin:  Intact without significant lesions or rashes. Psych:  Alert and cooperative. Normal mood and  affect.   Assessment   Hx Rectal bleeding now resolved, secondary to internal hemorrhoids vs diverticular  PBC compensated, with cirrhosis, newly diagnosed cirrhosis. She would like to hold off on EGD right now and consider this in the future.    PLAN    MELD labs and AFP today, Hep A and B serologies Consider EGD when willing RUQ US  in June 2026 Continue Urso  BID Return in 6 months    Therisa MICAEL Stager, PhD, [facility]    "

## 2024-07-09 NOTE — Patient Instructions (Signed)
 I have ordered routine blood work you can have done today!  We will arrange an ultrasound around June of next year.  We will see you in 6 months.  Continue taking the Urso  twice a day as you are doing.  I have attached a sheet here for more information!  I enjoyed seeing you again today! I value our relationship and want to provide genuine, compassionate, and quality care. You may receive a survey regarding your visit with me, and I welcome your feedback! Thanks so much for taking the time to complete this. I look forward to seeing you again.      Therisa MICAEL Stager, PhD, ANP-BC [facility]

## 2024-07-30 LAB — HEPATITIS B SURFACE ANTIBODY,QUALITATIVE: Hep B S Ab: NONREACTIVE

## 2024-07-30 LAB — HEPATITIS A ANTIBODY, TOTAL: Hepatitis A AB,Total: NONREACTIVE

## 2024-07-31 LAB — CBC WITH DIFFERENTIAL/PLATELET
Absolute Lymphocytes: 1996 {cells}/uL (ref 850–3900)
Absolute Monocytes: 312 {cells}/uL (ref 200–950)
Basophils Absolute: 52 {cells}/uL (ref 0–200)
Basophils Relative: 1.3 %
Eosinophils Absolute: 172 {cells}/uL (ref 15–500)
Eosinophils Relative: 4.3 %
HCT: 36.4 % (ref 35.9–46.0)
Hemoglobin: 11.2 g/dL — ABNORMAL LOW (ref 11.7–15.5)
MCH: 28.2 pg (ref 27.0–33.0)
MCHC: 30.8 g/dL — ABNORMAL LOW (ref 31.6–35.4)
MCV: 91.7 fL (ref 81.4–101.7)
MPV: 11.3 fL (ref 7.5–12.5)
Monocytes Relative: 7.8 %
Neutro Abs: 1468 {cells}/uL — ABNORMAL LOW (ref 1500–7800)
Neutrophils Relative %: 36.7 %
Platelets: 283 Thousand/uL (ref 140–400)
RBC: 3.97 Million/uL (ref 3.80–5.10)
RDW: 13.2 % (ref 11.0–15.0)
Total Lymphocyte: 49.9 %
WBC: 4 Thousand/uL (ref 3.8–10.8)

## 2024-07-31 LAB — COMPREHENSIVE METABOLIC PANEL WITH GFR
AG Ratio: 1.3 (calc) (ref 1.0–2.5)
ALT: 14 U/L (ref 6–29)
AST: 18 U/L (ref 10–35)
Albumin: 3.9 g/dL (ref 3.6–5.1)
Alkaline phosphatase (APISO): 92 U/L (ref 37–153)
BUN/Creatinine Ratio: 15 (calc) (ref 6–22)
BUN: 18 mg/dL (ref 7–25)
CO2: 27 mmol/L (ref 20–32)
Calcium: 9.5 mg/dL (ref 8.6–10.4)
Chloride: 107 mmol/L (ref 98–110)
Creat: 1.17 mg/dL — ABNORMAL HIGH (ref 0.60–0.95)
Globulin: 3 g/dL (ref 1.9–3.7)
Glucose, Bld: 81 mg/dL (ref 65–99)
Potassium: 4 mmol/L (ref 3.5–5.3)
Sodium: 141 mmol/L (ref 135–146)
Total Bilirubin: 0.4 mg/dL (ref 0.2–1.2)
Total Protein: 6.9 g/dL (ref 6.1–8.1)
eGFR: 46 mL/min/1.73m2 — ABNORMAL LOW

## 2024-07-31 LAB — PROTIME-INR
INR: 1
Prothrombin Time: 10.4 s (ref 9.0–11.5)

## 2024-07-31 LAB — TSH: TSH: 1.08 m[IU]/L (ref 0.40–4.50)

## 2024-07-31 LAB — VITAMIN D 25 HYDROXY (VIT D DEFICIENCY, FRACTURES): Vit D, 25-Hydroxy: 34 ng/mL (ref 30–100)

## 2024-07-31 LAB — AFP TUMOR MARKER: AFP-Tumor Marker: 3.4 ng/mL

## 2024-08-09 ENCOUNTER — Other Ambulatory Visit: Payer: Self-pay | Admitting: "Endocrinology

## 2024-08-26 ENCOUNTER — Ambulatory Visit: Payer: Self-pay | Admitting: Gastroenterology

## 2024-09-19 ENCOUNTER — Ambulatory Visit: Admitting: "Endocrinology
